# Patient Record
Sex: Female | Born: 2000 | Race: Black or African American | Hispanic: No | Marital: Single | Smoking: Current some day smoker
Health system: Southern US, Community
[De-identification: ages and names within clinical notes are randomized; demographics above are authoritative.]

## PROBLEM LIST (undated history)

## (undated) DIAGNOSIS — D649 Anemia, unspecified: Secondary | ICD-10-CM

## (undated) DIAGNOSIS — F3181 Bipolar II disorder: Secondary | ICD-10-CM

## (undated) DIAGNOSIS — Z975 Presence of (intrauterine) contraceptive device: Secondary | ICD-10-CM

## (undated) DIAGNOSIS — F29 Unspecified psychosis not due to a substance or known physiological condition: Secondary | ICD-10-CM

## (undated) DIAGNOSIS — F909 Attention-deficit hyperactivity disorder, unspecified type: Secondary | ICD-10-CM

## (undated) DIAGNOSIS — R079 Chest pain, unspecified: Secondary | ICD-10-CM

## (undated) DIAGNOSIS — G4700 Insomnia, unspecified: Secondary | ICD-10-CM

## (undated) DIAGNOSIS — F1220 Cannabis dependence, uncomplicated: Secondary | ICD-10-CM

## (undated) DIAGNOSIS — F17200 Nicotine dependence, unspecified, uncomplicated: Secondary | ICD-10-CM

## (undated) DIAGNOSIS — F411 Generalized anxiety disorder: Secondary | ICD-10-CM

## (undated) HISTORY — DX: Attention-deficit hyperactivity disorder, unspecified type: F90.9

## (undated) HISTORY — DX: Anemia, unspecified: D64.9

## (undated) HISTORY — PX: TONSILLECTOMY AND ADENOIDECTOMY: SHX28

## (undated) HISTORY — DX: Chest pain, unspecified: R07.9

## (undated) HISTORY — DX: Insomnia, unspecified: G47.00

## (undated) HISTORY — DX: Cannabis dependence, uncomplicated: F12.20

## (undated) HISTORY — DX: Generalized anxiety disorder: F41.1

## (undated) HISTORY — DX: Unspecified psychosis not due to a substance or known physiological condition: F29

## (undated) HISTORY — DX: Nicotine dependence, unspecified, uncomplicated: F17.200

## (undated) HISTORY — DX: Bipolar II disorder: F31.81

---
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
## 2019-02-22 ENCOUNTER — Encounter: Payer: Self-pay | Admitting: Family Medicine

## 2019-02-22 ENCOUNTER — Other Ambulatory Visit: Payer: Self-pay

## 2019-02-22 ENCOUNTER — Ambulatory Visit (INDEPENDENT_AMBULATORY_CARE_PROVIDER_SITE_OTHER): Payer: Medicaid Other | Admitting: Family Medicine

## 2019-02-22 VITALS — BP 114/66 | HR 58 | Temp 98.4°F | Ht 64.5 in | Wt 189.2 lb

## 2019-02-22 DIAGNOSIS — Z789 Other specified health status: Secondary | ICD-10-CM | POA: Diagnosis not present

## 2019-02-22 DIAGNOSIS — N926 Irregular menstruation, unspecified: Secondary | ICD-10-CM | POA: Diagnosis not present

## 2019-02-22 DIAGNOSIS — Z09 Encounter for follow-up examination after completed treatment for conditions other than malignant neoplasm: Secondary | ICD-10-CM | POA: Diagnosis not present

## 2019-02-22 NOTE — Progress Notes (Signed)
Patient [facility]   Established Patient Office Visit  Subjective:  Patient ID: Courtney Ashley, female    DOB: 2001-05-17  Age: 18 y.o. MRN: [id number]  CC:  Chief Complaint  Patient presents with  . Follow-up    3 month follow up , no other concerns     HPI Courtney Ashley is a 18 year old female who presents for Follow Up today.   Past Medical History:  Diagnosis Date  . ADHD (attention deficit hyperactivity disorder)    childhood  . Anemia    Current Status: Since her last office visit, she is doing well with no complaints. She states that she continues to have mild bleeding episodes, but they are stable. She continues to use Nexplanon since 2018. She denies fevers, chills, fatigue, recent infections, weight loss, and night sweats. She has not had any headaches, visual changes, dizziness, and falls. No chest pain, heart palpitations, cough and shortness of breath reported. No reports of GI problems such as nausea, vomiting, diarrhea, and constipation. She has no reports of blood in stools, dysuria and hematuria. No depression or anxiety reported. She denies pain today.   Past Surgical History:  Procedure Laterality Date  . TONSILLECTOMY AND ADENOIDECTOMY      Family History  Problem Relation Age of Onset  . Diabetes Maternal Grandmother     Social History   Socioeconomic History  . Marital status: Single    Spouse name: Not on file  . Number of children: Not on file  . Years of education: Not on file  . Highest education level: Not on file  Occupational History  . Not on file  Social Needs  . Financial resource strain: Not on file  . Food insecurity    Worry: Not on file    Inability: Not on file  . Transportation needs    Medical: Not on file    Non-medical: Not on file  Tobacco Use  . Smoking status: Never Smoker  . Smokeless tobacco: Never Used  Substance and Sexual Activity  . Alcohol use: Not Currently  . Drug  use: Yes    Types: Marijuana  . Sexual activity: Yes    Birth control/protection: Implant  Lifestyle  . Physical activity    Days per week: Not on file    Minutes per session: Not on file  . Stress: Not on file  Relationships  . Social Herbalist on phone: Not on file    Gets together: Not on file    Attends religious service: Not on file    Active member of club or organization: Not on file    Attends meetings of clubs or organizations: Not on file    Relationship status: Not on file  . Intimate partner violence    Fear of current or ex partner: Not on file    Emotionally abused: Not on file    Physically abused: Not on file    Forced sexual activity: Not on file  Other Topics Concern  . Not on file  Social History Narrative  . Not on file    Outpatient Medications Prior to Visit  Medication Sig Dispense Refill  . etonogestrel (NEXPLANON) 68 MG IMPL implant 1 each by Subdermal route once.    . sulfamethoxazole-trimethoprim (BACTRIM DS) 800-160 MG tablet Take 1 tablet by mouth 2 (two) times daily. (Patient not taking: Reported on 02/22/2019) 14 tablet 0   No facility-administered medications prior  to visit.     No Known Allergies  ROS Review of Systems  Constitutional: Negative.   HENT: Negative.   Eyes: Negative.   Respiratory: Negative.   Cardiovascular: Negative.   Gastrointestinal: Negative.   Endocrine: Negative.   Genitourinary: Positive for vaginal bleeding.       Irregular vaginal bleeding, r/t Nexplanon  Musculoskeletal: Negative.   Skin: Negative.   Allergic/Immunologic: Negative.   Neurological: Negative.   Hematological: Negative.   Psychiatric/Behavioral: Negative.    Objective:    Physical Exam  Constitutional: She is oriented to person, place, and time. She appears well-developed and well-nourished.  HENT:  Head: Atraumatic.  Eyes: Conjunctivae are normal.  Neck: Normal range of motion. Neck supple.  Cardiovascular: Normal rate,  regular rhythm, normal heart sounds and intact distal pulses.  Pulmonary/Chest: Effort normal and breath sounds normal.  Abdominal: Soft. Bowel sounds are normal.  Musculoskeletal: Normal range of motion.  Neurological: She is alert and oriented to person, place, and time. She has normal reflexes.  Skin: Skin is warm and dry.  Psychiatric: She has a normal mood and affect. Her behavior is normal. Judgment and thought content normal.  Vitals reviewed.   BP 114/66 (BP Location: Right Arm, Patient Position: Sitting, Cuff Size: Normal)   Pulse (!) 58   Temp 98.4 F (36.9 C) (Oral)   Ht 5' 4.5" (1.638 m)   Wt 189 lb 3.2 oz (85.8 kg)   LMP 02/11/2019   SpO2 100%   BMI 31.97 kg/m  Wt Readings from Last 3 Encounters:  02/22/19 189 lb 3.2 oz (85.8 kg) (96 %, Z= 1.78)*  11/21/18 205 lb (93 kg) (98 %, Z= 2.02)*  11/19/18 203 lb (92.1 kg) (98 %, Z= 1.99)*   * Growth percentiles are based on CDC (Girls, 2-20 Years) data.   Health Maintenance Due  Topic Date Due  . INFLUENZA VACCINE  01/05/2019   There are no preventive care reminders to display for this patient.  No results found for: TSH Lab Results  Component Value Date   WBC 9.6 11/21/2018   HGB 13.3 11/21/2018   HCT 40.3 11/21/2018   MCV 79 11/21/2018   PLT 269 11/21/2018   Lab Results  Component Value Date   NA 139 11/21/2018   K 3.6 11/21/2018   CO2 22 11/21/2018   GLUCOSE 92 11/21/2018   BUN 9 11/21/2018   CREATININE 0.87 11/21/2018   BILITOT 0.2 11/21/2018   ALKPHOS 46 11/21/2018   AST 12 11/21/2018   ALT 6 11/21/2018   PROT 6.8 11/21/2018   ALBUMIN 4.7 11/21/2018   CALCIUM 9.9 11/21/2018   No results found for: CHOL No results found for: HDL No results found for: LDLCALC No results found for: TRIG No results found for: [facility] Lab Results  Component Value Date   HGBA1C 5.6 11/21/2018   Assessment & Plan:   1. Irregular menstrual bleeding Mild.   2. Uses contraceptive implant for birth control  Currently using Nexplanon for contraception.  3. Follow up She will follow up in 1 year or as needed.   No orders of the defined types were placed in this encounter.   No orders of the defined types were placed in this encounter.   Referral Orders  No referral(s) requested today    Raliegh Ip,  MSN, FNP-BC [facility] [address] [place], [place] [address] [phone] fax   Problem List Items Addressed This  Visit    None    Visit Diagnoses    Irregular menstrual bleeding    -  Primary   Uses contraceptive implant for birth control       Follow up          No orders of the defined types were placed in this encounter.   Follow-up: Return in about 1 year (around 02/22/2020).    Kallie LocksNatalie M Verbon Giangregorio, FNP

## 2019-08-09 ENCOUNTER — Telehealth: Payer: Self-pay | Admitting: Family Medicine

## 2019-08-09 NOTE — Telephone Encounter (Signed)
Called pt and reminded them of appointment on Monday

## 2019-08-12 ENCOUNTER — Other Ambulatory Visit: Payer: Self-pay

## 2019-08-12 ENCOUNTER — Encounter: Payer: Self-pay | Admitting: Family Medicine

## 2019-08-12 ENCOUNTER — Ambulatory Visit (INDEPENDENT_AMBULATORY_CARE_PROVIDER_SITE_OTHER): Payer: Medicaid Other | Admitting: Family Medicine

## 2019-08-12 VITALS — BP 111/66 | HR 89 | Temp 99.3°F | Ht 64.2 in | Wt 147.2 lb

## 2019-08-12 DIAGNOSIS — Z789 Other specified health status: Secondary | ICD-10-CM

## 2019-08-12 DIAGNOSIS — R8290 Unspecified abnormal findings in urine: Secondary | ICD-10-CM

## 2019-08-12 DIAGNOSIS — Z0000 Encounter for general adult medical examination without abnormal findings: Secondary | ICD-10-CM

## 2019-08-12 DIAGNOSIS — Z09 Encounter for follow-up examination after completed treatment for conditions other than malignant neoplasm: Secondary | ICD-10-CM

## 2019-08-12 DIAGNOSIS — Z3046 Encounter for surveillance of implantable subdermal contraceptive: Secondary | ICD-10-CM | POA: Diagnosis not present

## 2019-08-12 DIAGNOSIS — F419 Anxiety disorder, unspecified: Secondary | ICD-10-CM | POA: Diagnosis not present

## 2019-08-12 LAB — POCT URINALYSIS DIPSTICK
Glucose, UA: NEGATIVE
Ketones, UA: 15
Nitrite, UA: POSITIVE
Protein, UA: POSITIVE — AB
Spec Grav, UA: >=1.03 — AB (ref 1.010–1.025)
Urobilinogen, UA: 0.2 E.U./dL
pH, UA: 6 (ref 5.0–8.0)

## 2019-08-12 LAB — POCT URINE PREGNANCY: Preg Test, Ur: NEGATIVE

## 2019-08-12 MED ORDER — BUSPIRONE HCL 5 MG PO TABS: 5.0000 mg | ORAL_TABLET | Freq: Two times a day (BID) | ORAL | 3 refills | Status: DC

## 2019-08-12 MED ORDER — NORETHIN ACE-ETH ESTRAD-FE 1-20 MG-MCG PO TABS: 1.0000 | ORAL_TABLET | Freq: Every day | ORAL | 11 refills | Status: DC

## 2019-08-12 NOTE — Patient Instructions (Signed)
Contraception Choices Contraception, also called birth control, means things to use or ways to try not to get pregnant. Hormonal birth control This kind of birth control uses hormones. Here are some types of hormonal birth control:  A tube that is put under skin of the arm (implant). The tube can stay in for as long as 3 years.  Shots to get every 3 months (injections).  Pills to take every day (birth control pills).  A patch to change 1 time each week for 3 weeks (birth control patch). After that, the patch is taken off for 1 week.  A ring to put in the vagina. The ring is left in for 3 weeks. Then it is taken out of the vagina for 1 week. Then a new ring is put in.  Pills to take after unprotected sex (emergency birth control pills). Barrier birth control Here are some types of barrier birth control:  A thin covering that is put on the penis before sex (female condom). The covering is thrown away after sex.  A soft, loose covering that is put in the vagina before sex (female condom). The covering is thrown away after sex.  A rubber bowl that sits over the cervix (diaphragm). The bowl must be made for you. The bowl is put into the vagina before sex. The bowl is left in for 6-8 hours after sex. It is taken out within 24 hours.  A small, soft cup that fits over the cervix (cervical cap). The cup must be made for you. The cup can be left in for 6-8 hours after sex. It is taken out within 48 hours.  A sponge that is put into the vagina before sex. It must be left in for at least 6 hours after sex. It must be taken out within 30 hours. Then it is thrown away.  A chemical that kills or stops sperm from getting into the uterus (spermicide). It may be a pill, cream, jelly, or foam to put in the vagina. The chemical should be used at least 10-15 minutes before sex. IUD (intrauterine) birth control An IUD is a small, T-shaped piece of plastic. It is put inside the uterus. There are two  kinds:  Hormone IUD. This kind can stay in for 3-5 years.  Copper IUD. This kind can stay in for 10 years. Permanent birth control Here are some types of permanent birth control:  Surgery to block the fallopian tubes.  Having an insert put into each fallopian tube.  Surgery to tie off the tubes that carry sperm (vasectomy). Natural planning birth control Here are some types of natural planning birth control:  Not having sex on the days the woman could get pregnant.  Using a calendar: ? To keep track of the length of each period. ? To find out what days pregnancy can happen. ? To plan to not have sex on days when pregnancy can happen.  Watching for symptoms of ovulation and not having sex during ovulation. One way the woman can check for ovulation is to check her temperature.  Waiting to have sex until after ovulation. Summary  Contraception, also called birth control, means things to use or ways to try not to get pregnant.  Hormonal methods of birth control include implants, injections, pills, patches, vaginal rings, and emergency birth control pills.  Barrier methods of birth control can include female condoms, female condoms, diaphragms, cervical caps, sponges, and spermicides.  There are two types of IUD (intrauterine device) birth control.   An IUD can be put in a woman's uterus to prevent pregnancy for 3-5 years.  Permanent sterilization can be done through a procedure for males, females, or both.  Natural planning methods involve not having sex on the days when the woman could get pregnant. This information is not intended to replace advice given to you by your health care provider. Make sure you discuss any questions you have with your health care provider. Document Revised: 09/12/2018 Document Reviewed: 06/02/2016 Elsevier Patient Education  2020 Elsevier Inc. Buspirone tablets What is this medicine? BUSPIRONE (byoo SPYE rone) is used to treat anxiety disorders. This  medicine may be used for other purposes; ask your health care provider or pharmacist if you have questions. COMMON BRAND NAME(S): BuSpar What should I tell my health care provider before I take this medicine? They need to know if you have any of these conditions:  kidney or liver disease  an unusual or allergic reaction to buspirone, other medicines, foods, dyes, or preservatives  pregnant or trying to get pregnant  breast-feeding How should I use this medicine? Take this medicine by mouth with a glass of water. Follow the directions on the prescription label. You may take this medicine with or without food. To ensure that this medicine always works the same way for you, you should take it either always with or always without food. Take your doses at regular intervals. Do not take your medicine more often than directed. Do not stop taking except on the advice of your doctor or health care professional. Talk to your pediatrician regarding the use of this medicine in children. Special care may be needed. Overdosage: If you think you have taken too much of this medicine contact a poison control center or emergency room at once. NOTE: This medicine is only for you. Do not share this medicine with others. What if I miss a dose? If you miss a dose, take it as soon as you can. If it is almost time for your next dose, take only that dose. Do not take double or extra doses. What may interact with this medicine? Do not take this medicine with any of the following medications:  linezolid  MAOIs like Carbex, Eldepryl, Marplan, Nardil, and Parnate  methylene blue  procarbazine This medicine may also interact with the following medications:  diazepam  digoxin  diltiazem  erythromycin  grapefruit juice  haloperidol  medicines for mental depression or mood problems  medicines for seizures like carbamazepine, phenobarbital and phenytoin  nefazodone  other medications for  anxiety  rifampin  ritonavir  some antifungal medicines like itraconazole, ketoconazole, and voriconazole  verapamil  warfarin This list may not describe all possible interactions. Give your health care provider a list of all the medicines, herbs, non-prescription drugs, or dietary supplements you use. Also tell them if you smoke, drink alcohol, or use illegal drugs. Some items may interact with your medicine. What should I watch for while using this medicine? Visit your doctor or health care professional for regular checks on your progress. It may take 1 to 2 weeks before your anxiety gets better. You may get drowsy or dizzy. Do not drive, use machinery, or do anything that needs mental alertness until you know how this drug affects you. Do not stand or sit up quickly, especially if you are an older patient. This reduces the risk of dizzy or fainting spells. Alcohol can make you more drowsy and dizzy. Avoid alcoholic drinks. What side effects may I notice from  receiving this medicine? Side effects that you should report to your doctor or health care professional as soon as possible:  blurred vision or other vision changes  chest pain  confusion  difficulty breathing  feelings of hostility or anger  muscle aches and pains  numbness or tingling in hands or feet  ringing in the ears  skin rash and itching  vomiting  weakness Side effects that usually do not require medical attention (report to your doctor or health care professional if they continue or are bothersome):  disturbed dreams, nightmares  headache  nausea  restlessness or nervousness  sore throat and nasal congestion  stomach upset This list may not describe all possible side effects. Call your doctor for medical advice about side effects. You may report side effects to FDA at 1-800-FDA-1088. Where should I keep my medicine? Keep out of the reach of children. Store at room temperature below 30 degrees C  (86 degrees F). Protect from light. Keep container tightly closed. Throw away any unused medicine after the expiration date. NOTE: This sheet is a summary. It may not cover all possible information. If you have questions about this medicine, talk to your doctor, pharmacist, or health care provider.  2020 Elsevier/Gold Standard (2009-12-31 18:06:11) Generalized Anxiety Disorder, Adult Generalized anxiety disorder (GAD) is a mental health disorder. People with this condition constantly worry about everyday events. Unlike normal anxiety, worry related to GAD is not triggered by a specific event. These worries also do not fade or get better with time. GAD interferes with life functions, including relationships, work, and school. GAD can vary from mild to severe. People with severe GAD can have intense waves of anxiety with physical symptoms (panic attacks). What are the causes? The exact cause of GAD is not known. What increases the risk? This condition is more likely to develop in:  Women.  People who have a family history of anxiety disorders.  People who are very shy.  People who experience very stressful life events, such as the death of a loved one.  People who have a very stressful family environment. What are the signs or symptoms? People with GAD often worry excessively about many things in their lives, such as their health and family. They may also be overly concerned about:  Doing well at work.  Being on time.  Natural disasters.  Friendships. Physical symptoms of GAD include:  Fatigue.  Muscle tension or having muscle twitches.  Trembling or feeling shaky.  Being easily startled.  Feeling like your heart is pounding or racing.  Feeling out of breath or like you cannot take a deep breath.  Having trouble falling asleep or staying asleep.  Sweating.  Nausea, diarrhea, or irritable bowel syndrome (IBS).  Headaches.  Trouble concentrating or remembering  facts.  Restlessness.  Irritability. How is this diagnosed? Your health care provider can diagnose GAD based on your symptoms and medical history. You will also have a physical exam. The health care provider will ask specific questions about your symptoms, including how severe they are, when they started, and if they come and go. Your health care provider may ask you about your use of alcohol or drugs, including prescription medicines. Your health care provider may refer you to a mental health specialist for further evaluation. Your health care provider will do a thorough examination and may perform additional tests to rule out other possible causes of your symptoms. To be diagnosed with GAD, a person must have anxiety that:  Is  out of his or her control.  Affects several different aspects of his or her life, such as work and relationships.  Causes distress that makes him or her unable to take part in normal activities.  Includes at least three physical symptoms of GAD, such as restlessness, fatigue, trouble concentrating, irritability, muscle tension, or sleep problems. Before your health care provider can confirm a diagnosis of GAD, these symptoms must be present more days than they are not, and they must last for six months or longer. How is this treated? The following therapies are usually used to treat GAD:  Medicine. Antidepressant medicine is usually prescribed for long-term daily control. Antianxiety medicines may be added in severe cases, especially when panic attacks occur.  Talk therapy (psychotherapy). Certain types of talk therapy can be helpful in treating GAD by providing support, education, and guidance. Options include: ? Cognitive behavioral therapy (CBT). People learn coping skills and techniques to ease their anxiety. They learn to identify unrealistic or negative thoughts and behaviors and to replace them with positive ones. ? Acceptance and commitment therapy (ACT).  This treatment teaches people how to be mindful as a way to cope with unwanted thoughts and feelings. ? Biofeedback. This process trains you to manage your body's response (physiological response) through breathing techniques and relaxation methods. You will work with a therapist while machines are used to monitor your physical symptoms.  Stress management techniques. These include yoga, meditation, and exercise. A mental health specialist can help determine which treatment is best for you. Some people see improvement with one type of therapy. However, other people require a combination of therapies. Follow these instructions at home:  Take over-the-counter and prescription medicines only as told by your health care provider.  Try to maintain a normal routine.  Try to anticipate stressful situations and allow extra time to manage them.  Practice any stress management or self-calming techniques as taught by your health care provider.  Do not punish yourself for setbacks or for not making progress.  Try to recognize your accomplishments, even if they are small.  Keep all follow-up visits as told by your health care provider. This is important. Contact a health care provider if:  Your symptoms do not get better.  Your symptoms get worse.  You have signs of depression, such as: ? A persistently sad, cranky, or irritable mood. ? Loss of enjoyment in activities that used to bring you joy. ? Change in weight or eating. ? Changes in sleeping habits. ? Avoiding friends or family members. ? Loss of energy for normal tasks. ? Feelings of guilt or worthlessness. Get help right away if:  You have serious thoughts about hurting yourself or others. If you ever feel like you may hurt yourself or others, or have thoughts about taking your own life, get help right away. You can go to your nearest emergency department or call:  Your local emergency services (911 in the [place]  A suicide crisis  helpline, such as the National Suicide Prevention Lifeline at (506)778-4372. This is open 24 hours a day. Summary  Generalized anxiety disorder (GAD) is a mental health disorder that involves worry that is not triggered by a specific event.  People with GAD often worry excessively about many things in their lives, such as their health and family.  GAD may cause physical symptoms such as restlessness, trouble concentrating, sleep problems, frequent sweating, nausea, diarrhea, headaches, and trembling or muscle twitching.  A mental health specialist can help determine which treatment  is best for you. Some people see improvement with one type of therapy. However, other people require a combination of therapies. This information is not intended to replace advice given to you by your health care provider. Make sure you discuss any questions you have with your health care provider. Document Revised: 05/05/2017 Document Reviewed: 04/12/2016 Elsevier Patient Education  2020 [employer].

## 2019-08-12 NOTE — Progress Notes (Signed)
[facility]    Established Patient Office Visit  Subjective:  Patient ID: Courtney Ashley, female    DOB: April 27, 2001  Age: 19 y.o. MRN: [id number]  CC:  Chief Complaint  Patient presents with  . Follow-up    Nexplanon removed    HPI Courtney Ashley is a 19 year old female who presents for Follow Up today.   Past Medical History:  Diagnosis Date  . ADHD (attention deficit hyperactivity disorder)    childhood  . Anemia   . Nexplanon in place 2018   Current Status: Since her last office visit, she is doing well with no complaints.  She is here today for Nexplanon removal. She is also requested for anxiety medication. She denies suicidal ideations, homicidal ideations, or auditory hallucinations. She denies fevers, chills, fatigue, recent infections, weight loss, and night sweats. She has not had any headaches, visual changes, dizziness, and falls. No chest pain, heart palpitations, cough and shortness of breath reported. No reports of GI problems such as nausea, vomiting, diarrhea, and constipation. She has no reports of blood in stools, dysuria and hematuria. She denies pain today.   Past Surgical History:  Procedure Laterality Date  . TONSILLECTOMY AND ADENOIDECTOMY      Family History  Problem Relation Age of Onset  . Diabetes Maternal Grandmother     Social History   Socioeconomic History  . Marital status: Single    Spouse name: Not on file  . Number of children: Not on file  . Years of education: Not on file  . Highest education level: Not on file  Occupational History  . Not on file  Tobacco Use  . Smoking status: Never Smoker  . Smokeless tobacco: Never Used  Substance and Sexual Activity  . Alcohol use: Not Currently  . Drug use: Yes    Types: Marijuana  . Sexual activity: Yes    Birth control/protection: Implant  Other Topics Concern  . Not on file  Social History Narrative  . Not on file   Social  Determinants of Health   Financial Resource Strain:   . Difficulty of Paying Living Expenses: Not on file  Food Insecurity:   . Worried About Programme researcher, broadcasting/film/video in the Last Year: Not on file  . Ran Out of Food in the Last Year: Not on file  Transportation Needs:   . Lack of Transportation (Medical): Not on file  . Lack of Transportation (Non-Medical): Not on file  Physical Activity:   . Days of Exercise per Week: Not on file  . Minutes of Exercise per Session: Not on file  Stress:   . Feeling of Stress : Not on file  Social Connections:   . Frequency of Communication with Friends and Family: Not on file  . Frequency of Social Gatherings with Friends and Family: Not on file  . Attends Religious Services: Not on file  . Active Member of Clubs or Organizations: Not on file  . Attends Banker Meetings: Not on file  . Marital Status: Not on file  Intimate Partner Violence:   . Fear of Current or Ex-Partner: Not on file  . Emotionally Abused: Not on file  . Physically Abused: Not on file  . Sexually Abused: Not on file    Outpatient Medications Prior to Visit  Medication Sig Dispense Refill  . etonogestrel (NEXPLANON) 68 MG IMPL implant 1 each by Subdermal route once.    . sulfamethoxazole-trimethoprim (BACTRIM DS)  800-160 MG tablet Take 1 tablet by mouth 2 (two) times daily. (Patient not taking: Reported on 02/22/2019) 14 tablet 0   No facility-administered medications prior to visit.    No Known Allergies  ROS Review of Systems  Constitutional: Negative.   HENT: Negative.   Eyes: Negative.   Respiratory: Negative.   Cardiovascular: Negative.   Gastrointestinal: Negative.   Endocrine: Negative.   Genitourinary: Negative.   Musculoskeletal: Negative.   Skin: Negative.   Allergic/Immunologic: Negative.   Neurological: Negative.   Hematological: Negative.   Psychiatric/Behavioral: Negative.       Objective:    Physical Exam  Constitutional: She is  oriented to person, place, and time. She appears well-developed and well-nourished.  HENT:  Head: Normocephalic and atraumatic.  Eyes: Conjunctivae are normal.  Cardiovascular: Normal rate, regular rhythm, normal heart sounds and intact distal pulses.  Pulmonary/Chest: Effort normal and breath sounds normal.  Abdominal: Soft. Bowel sounds are normal.  Musculoskeletal:        General: Normal range of motion.     Cervical back: Normal range of motion and neck supple.  Neurological: She is alert and oriented to person, place, and time. She has normal reflexes.  Skin: Skin is warm.  Psychiatric: She has a normal mood and affect. Her behavior is normal. Judgment and thought content normal.  Nursing note and vitals reviewed.   BP 111/66   Pulse 89   Temp 99.3 F (37.4 C) (Oral)   Ht 5' 4.2" (1.631 m)   Wt 147 lb 3.2 oz (66.8 kg)   SpO2 100%   BMI 25.11 kg/m  Wt Readings from Last 3 Encounters:  08/12/19 147 lb 3.2 oz (66.8 kg) (77 %, Z= 0.75)*  02/22/19 189 lb 3.2 oz (85.8 kg) (96 %, Z= 1.78)*  11/21/18 205 lb (93 kg) (98 %, Z= 2.02)*   * Growth percentiles are based on CDC (Girls, 2-20 Years) data.     Health Maintenance Due  Topic Date Due  . INFLUENZA VACCINE  01/05/2019    There are no preventive care reminders to display for this patient.  No results found for: TSH Lab Results  Component Value Date   WBC 9.6 11/21/2018   HGB 13.3 11/21/2018   HCT 40.3 11/21/2018   MCV 79 11/21/2018   PLT 269 11/21/2018   Lab Results  Component Value Date   NA 139 11/21/2018   K 3.6 11/21/2018   CO2 22 11/21/2018   GLUCOSE 92 11/21/2018   BUN 9 11/21/2018   CREATININE 0.87 11/21/2018   BILITOT 0.2 11/21/2018   ALKPHOS 46 11/21/2018   AST 12 11/21/2018   ALT 6 11/21/2018   PROT 6.8 11/21/2018   ALBUMIN 4.7 11/21/2018   CALCIUM 9.9 11/21/2018   No results found for: CHOL No results found for: HDL No results found for: LDLCALC No results found for: TRIG No results  found for: [facility] Lab Results  Component Value Date   HGBA1C 5.6 11/21/2018      Assessment & Plan:   1. Nexplanon removal Implant removed from left arm successfully. Patient tolerated well. She will keep dressing on X 24 hours.   2. Uses contraceptive implant for birth control - POCT urine pregnancy - norethindrone-ethinyl estradiol (LOESTRIN FE) 1-20 MG-MCG tablet; Take 1 tablet by mouth daily.  Dispense: 1 Package; Refill: 11  3. Anxiety We will initiate Buspar today.  - busPIRone (BUSPAR) 5 MG tablet; Take 1 tablet (5 mg total) by mouth 2 (two) times daily.  Dispense: 60 tablet; Refill: 3  4. Health care maintenance - POCT urinalysis dipstick  5. Abnormal urine Results are pending.  - Urine Culture  6. Follow up She will follow up in 2 months.   Meds ordered this encounter  Medications  . norethindrone-ethinyl estradiol (LOESTRIN FE) 1-20 MG-MCG tablet    Sig: Take 1 tablet by mouth daily.    Dispense:  1 Package    Refill:  11  . busPIRone (BUSPAR) 5 MG tablet    Sig: Take 1 tablet (5 mg total) by mouth 2 (two) times daily.    Dispense:  60 tablet    Refill:  3    Orders Placed This Encounter  Procedures  . Urine Culture  . POCT urinalysis dipstick  . POCT urine pregnancy    Referral Orders  No referral(s) requested today    Kathe Becton,  MSN, FNP-BC [place], [place] [address] [phone] fax   Problem List Items Addressed This Visit      Other   Uses contraceptive implant for birth control   Relevant Medications   norethindrone-ethinyl estradiol (LOESTRIN FE) 1-20 MG-MCG tablet   Other Relevant Orders   POCT urine pregnancy (Completed)    Other Visit Diagnoses    Nexplanon removal    -  Primary   Anxiety       Relevant Medications   busPIRone (BUSPAR) 5 MG tablet   Health care maintenance       Relevant Orders   POCT  urinalysis dipstick (Completed)   Abnormal urine       Relevant Orders   Urine Culture   Follow up          Meds ordered this encounter  Medications  . norethindrone-ethinyl estradiol (LOESTRIN FE) 1-20 MG-MCG tablet    Sig: Take 1 tablet by mouth daily.    Dispense:  1 Package    Refill:  11  . busPIRone (BUSPAR) 5 MG tablet    Sig: Take 1 tablet (5 mg total) by mouth 2 (two) times daily.    Dispense:  60 tablet    Refill:  3    Follow-up: Return in about 2 months (around 10/12/2019).    Azzie Glatter, FNP

## 2019-08-14 DIAGNOSIS — F419 Anxiety disorder, unspecified: Secondary | ICD-10-CM | POA: Insufficient documentation

## 2019-08-15 LAB — URINE CULTURE

## 2019-08-18 ENCOUNTER — Other Ambulatory Visit: Payer: Self-pay | Admitting: Family Medicine

## 2019-08-18 DIAGNOSIS — N390 Urinary tract infection, site not specified: Secondary | ICD-10-CM

## 2019-08-18 MED ORDER — NITROFURANTOIN MONOHYD MACRO 100 MG PO CAPS: 100.0000 mg | ORAL_CAPSULE | Freq: Two times a day (BID) | ORAL | 0 refills | Status: AC

## 2019-08-20 ENCOUNTER — Telehealth: Payer: Self-pay

## 2019-08-20 NOTE — Telephone Encounter (Signed)
-----   Message from Kallie Locks, FNP sent at 08/18/2019  9:09 PM EDT ----- Urine culture identified bacteria. Please inform patient that we have sent new Rx for Macrobid to pharmacy today. This antibiotic is specific to eliminate this bacteria. She is to take medication as prescribed. She is to complete all medication. Increase fluids. Keep follow up appointment as scheduled. Please inform patient.

## 2019-08-20 NOTE — Telephone Encounter (Signed)
Second message left today for a call back.

## 2019-08-22 NOTE — Telephone Encounter (Signed)
Multiple attempts to reach out to [facility] with no success. Her contacts where asked to notify patient to call back. Still no success. Lab result and message mailed out today.

## 2019-08-22 NOTE — Telephone Encounter (Signed)
-----   Message from Natalie M Stroud, FNP sent at 08/18/2019  9:09 PM EDT ----- Urine culture identified bacteria. Please inform patient that we have sent new Rx for Macrobid to pharmacy today. This antibiotic is specific to eliminate this bacteria. She is to take medication as prescribed. She is to complete all medication. Increase fluids. Keep follow up appointment as scheduled. Please inform patient.  

## 2019-10-09 ENCOUNTER — Other Ambulatory Visit: Payer: Self-pay

## 2019-10-09 ENCOUNTER — Encounter (HOSPITAL_COMMUNITY): Payer: Self-pay | Admitting: Emergency Medicine

## 2019-10-09 ENCOUNTER — Emergency Department (HOSPITAL_COMMUNITY)
Admission: EM | Admit: 2019-10-09 | Discharge: 2019-10-09 | Disposition: A | Discharge status: Home / Self Care | Payer: Medicaid Other | Attending: Emergency Medicine | Admitting: Emergency Medicine

## 2019-10-09 DIAGNOSIS — Z5321 Procedure and treatment not carried out due to patient leaving prior to being seen by health care provider: Secondary | ICD-10-CM | POA: Diagnosis not present

## 2019-10-09 DIAGNOSIS — K0889 Other specified disorders of teeth and supporting structures: Secondary | ICD-10-CM | POA: Insufficient documentation

## 2019-10-09 NOTE — ED Provider Notes (Signed)
Went to room and patient was not there. RN informed me that patient left. I did not participate in the care of this patient.   Portions of this note were generated with Scientist, clinical (histocompatibility and immunogenetics). Dictation errors may occur despite best attempts at proofreading.     Maxwell Caul, PA-C 10/09/19 1113    Terrilee Files, MD 10/10/19 [phone]

## 2019-10-09 NOTE — ED Notes (Signed)
Called patient name in the lobby to be triage and no one responded 

## 2019-10-09 NOTE — ED Triage Notes (Signed)
Per pt, states she has a cavity in right upper tooth-woke up this am with right facial swelling-had wisdom teeth pulled 4/10

## 2019-10-21 ENCOUNTER — Ambulatory Visit: Payer: Medicaid Other | Admitting: Family Medicine

## 2019-11-20 ENCOUNTER — Emergency Department (HOSPITAL_COMMUNITY)
Admission: EM | Admit: 2019-11-20 | Discharge: 2019-11-21 | Disposition: A | Discharge status: Home / Self Care | Payer: Medicaid Other | Attending: Emergency Medicine | Admitting: Emergency Medicine

## 2019-11-20 ENCOUNTER — Other Ambulatory Visit: Payer: Self-pay

## 2019-11-20 ENCOUNTER — Encounter (HOSPITAL_COMMUNITY): Payer: Self-pay

## 2019-11-20 DIAGNOSIS — D649 Anemia, unspecified: Secondary | ICD-10-CM | POA: Diagnosis not present

## 2019-11-20 DIAGNOSIS — Y999 Unspecified external cause status: Secondary | ICD-10-CM | POA: Insufficient documentation

## 2019-11-20 DIAGNOSIS — S61212A Laceration without foreign body of right middle finger without damage to nail, initial encounter: Secondary | ICD-10-CM | POA: Diagnosis present

## 2019-11-20 DIAGNOSIS — Y92039 Unspecified place in apartment as the place of occurrence of the external cause: Secondary | ICD-10-CM | POA: Diagnosis not present

## 2019-11-20 DIAGNOSIS — Z23 Encounter for immunization: Secondary | ICD-10-CM | POA: Diagnosis not present

## 2019-11-20 DIAGNOSIS — W458XXA Other foreign body or object entering through skin, initial encounter: Secondary | ICD-10-CM | POA: Diagnosis not present

## 2019-11-20 DIAGNOSIS — F909 Attention-deficit hyperactivity disorder, unspecified type: Secondary | ICD-10-CM | POA: Insufficient documentation

## 2019-11-20 DIAGNOSIS — Y9389 Activity, other specified: Secondary | ICD-10-CM | POA: Diagnosis not present

## 2019-11-20 DIAGNOSIS — S51811A Laceration without foreign body of right forearm, initial encounter: Secondary | ICD-10-CM | POA: Diagnosis not present

## 2019-11-20 MED ORDER — TETANUS-DIPHTH-ACELL PERTUSSIS 5-2.5-18.5 LF-MCG/0.5 IM SUSP
0.5000 mL | Freq: Once | INTRAMUSCULAR | Status: AC
  Administered 2019-11-20: 0.5 mL via INTRAMUSCULAR
  Filled 2019-11-20: qty 0.5

## 2019-11-20 MED ORDER — LIDOCAINE HCL (PF) 1 % IJ SOLN
5.0000 mL | Freq: Once | INTRAMUSCULAR | Status: AC
  Administered 2019-11-20: 5 mL via INTRADERMAL
  Filled 2019-11-20: qty 30
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
## 2019-11-20 NOTE — Discharge Instructions (Addendum)
Local wound care with bacitracin and dressing changes twice daily.  Sutures are to be removed in 7 days.  Follow-up with your primary doctor for this.  Return to the ER in the meantime if you develop redness, pus draining from the wound, worsening pain, or other new and concerning symptoms.

## 2019-11-21 NOTE — ED Notes (Signed)
Reviewed discharge instructions and suture care with pt. Pt verbalized understanding.

## 2019-12-16 ENCOUNTER — Telehealth: Payer: Self-pay | Admitting: Family Medicine

## 2019-12-16 NOTE — Telephone Encounter (Signed)
Pt was called and a message was left for pt to call a schedule appointment with Jolene Schimke

## 2019-12-23 ENCOUNTER — Telehealth: Payer: Self-pay | Admitting: Family Medicine

## 2019-12-23 NOTE — Telephone Encounter (Signed)
Called Pt no voicemail option no one answer

## 2022-07-26 ENCOUNTER — Encounter (HOSPITAL_COMMUNITY): Payer: Self-pay

## 2022-07-26 ENCOUNTER — Emergency Department (HOSPITAL_COMMUNITY)
Admission: EM | Admit: 2022-07-26 | Discharge: 2022-07-26 | Disposition: A | Discharge status: Home / Self Care | Payer: Medicaid Other | Attending: Emergency Medicine | Admitting: Emergency Medicine

## 2022-07-26 ENCOUNTER — Other Ambulatory Visit: Payer: Self-pay

## 2022-07-26 DIAGNOSIS — Z20822 Contact with and (suspected) exposure to covid-19: Secondary | ICD-10-CM | POA: Insufficient documentation

## 2022-07-26 DIAGNOSIS — R109 Unspecified abdominal pain: Secondary | ICD-10-CM | POA: Diagnosis present

## 2022-07-26 DIAGNOSIS — E876 Hypokalemia: Secondary | ICD-10-CM | POA: Insufficient documentation

## 2022-07-26 DIAGNOSIS — N3000 Acute cystitis without hematuria: Secondary | ICD-10-CM | POA: Insufficient documentation

## 2022-07-26 LAB — COMPREHENSIVE METABOLIC PANEL
ALT: 10 U/L (ref 0–44)
AST: 14 U/L — ABNORMAL LOW (ref 15–41)
Albumin: 4 g/dL (ref 3.5–5.0)
Alkaline Phosphatase: 33 U/L — ABNORMAL LOW (ref 38–126)
Anion gap: 9 (ref 5–15)
BUN: 6 mg/dL (ref 6–20)
CO2: 24 mmol/L (ref 22–32)
Calcium: 9.1 mg/dL (ref 8.9–10.3)
Chloride: 107 mmol/L (ref 98–111)
Creatinine, Ser: 0.91 mg/dL (ref 0.44–1.00)
GFR, Estimated: >60 mL/min (ref >60)
Glucose, Bld: 88 mg/dL (ref 70–99)
Potassium: 3.3 mmol/L — ABNORMAL LOW (ref 3.5–5.1)
Sodium: 140 mmol/L (ref 135–145)
Total Bilirubin: 0.5 mg/dL (ref 0.3–1.2)
Total Protein: 6.3 g/dL — ABNORMAL LOW (ref 6.5–8.1)

## 2022-07-26 LAB — RESP PANEL BY RT-PCR (RSV, FLU A&B, COVID)  RVPGX2
Influenza A by PCR: NEGATIVE
Influenza B by PCR: NEGATIVE
Resp Syncytial Virus by PCR: NEGATIVE
SARS Coronavirus 2 by RT PCR: NEGATIVE

## 2022-07-26 LAB — CBC WITH DIFFERENTIAL/PLATELET
Abs Immature Granulocytes: 0.02 10*3/uL (ref 0.00–0.07)
Basophils Absolute: 0 10*3/uL (ref 0.0–0.1)
Basophils Relative: 0 %
Eosinophils Absolute: 0 10*3/uL (ref 0.0–0.5)
Eosinophils Relative: 0 %
HCT: 38.9 % (ref 36.0–46.0)
Hemoglobin: 12.5 g/dL (ref 12.0–15.0)
Immature Granulocytes: 0 %
Lymphocytes Relative: 46 %
Lymphs Abs: 3.6 10*3/uL (ref 0.7–4.0)
MCH: 27.6 pg (ref 26.0–34.0)
MCHC: 32.1 g/dL (ref 30.0–36.0)
MCV: 85.9 fL (ref 80.0–100.0)
Monocytes Absolute: 0.5 10*3/uL (ref 0.1–1.0)
Monocytes Relative: 6 %
Neutro Abs: 3.6 10*3/uL (ref 1.7–7.7)
Neutrophils Relative %: 48 %
Platelets: 222 10*3/uL (ref 150–400)
RBC: 4.53 MIL/uL (ref 3.87–5.11)
RDW: 14.6 % (ref 11.5–15.5)
WBC: 7.7 10*3/uL (ref 4.0–10.5)
nRBC: 0 % (ref 0.0–0.2)

## 2022-07-26 LAB — URINALYSIS, ROUTINE W REFLEX MICROSCOPIC
Bilirubin Urine: NEGATIVE
Glucose, UA: NEGATIVE mg/dL
Ketones, ur: 5 mg/dL — AB
Nitrite: POSITIVE — AB
Protein, ur: NEGATIVE mg/dL
Specific Gravity, Urine: 1.017 (ref 1.005–1.030)
pH: 6 (ref 5.0–8.0)

## 2022-07-26 LAB — I-STAT BETA HCG BLOOD, ED (MC, WL, AP ONLY): I-stat hCG, quantitative: <5 m[IU]/mL (ref <5)

## 2022-07-26 LAB — LIPASE, BLOOD: Lipase: 31 U/L (ref 11–51)

## 2022-07-26 MED ORDER — CEPHALEXIN 500 MG PO CAPS: 500.0000 mg | ORAL_CAPSULE | Freq: Four times a day (QID) | ORAL | 0 refills | Status: DC

## 2022-07-26 NOTE — ED Notes (Signed)
Pt left prior to receiving AVS. Pt states she has mychart and can view results and prescriptions on there.

## 2022-07-26 NOTE — ED Provider Triage Note (Signed)
Emergency Medicine Provider Triage Evaluation Note  Courtney Ashley , a 22 y.o. female  was evaluated in triage.  Pt complains of diarrhea and abdominal pain.  Patient apparently had diarrhea after a veggie burger 2 days ago.  She also took a pill of unknown substance that made her stomach hurt and that it turned out to be a Fioricet..  Review of Systems  Positive:  Negative:   Physical Exam  BP (!) 142/83   Pulse 71   Temp 98.9 F (37.2 C) (Oral)   Resp 18   Ht 5' 4"$  (1.626 m)   Wt 64.9 kg   SpO2 100%   BMI 24.55 kg/m  Gen:   Awake, no distress   Resp:  Normal effort  MSK:   Moves extremities without difficulty  Other:  No TTP  Medical Decision Making  Medically screening exam initiated at 3:17 PM.  Appropriate orders placed.  Glennice Bleacher was informed that the remainder of the evaluation will be completed by another provider, this initial triage assessment does not replace that evaluation, and the importance of remaining in the ED until their evaluation is complete.     Margarita Mail, PA-C 07/26/22 1525

## 2022-07-26 NOTE — Discharge Instructions (Signed)
You have a UTI.  Take the Keflex as prescribed.  Return to the ED if you have fever, inability to eat or drink without vomiting, pain moving up to your side or new or concerning symptoms.

## 2022-07-26 NOTE — ED Provider Notes (Signed)
[facility] Provider Note   CSN: [id number] Arrival date & time: 07/26/22  1435     History  Chief Complaint  Patient presents with   Abdominal Pain   Medication Reaction    Courtney Ashley is a 22 y.o. female.   Abdominal Pain    This is a 22 year old female presenting to the emergency department due to abdominal pain.  Patient states 2 days ago she switched from eating meat to try to be vegetarian and was eating veggie patties.  She had having abdominal pain and had an episode of diarrhea.  No blood or mucus, no history of IBD.  She has not had any diarrhea today but states she took a Fioricet which she thought was a muscle relaxer and started feeling "weird" afterwards.  She denies any nausea or vomiting, no previous abdominal surgeries.  She has been been using the bathroom more regularly and feels like over her bladder it is occasionally tight but not specifically painful.  Denies any hematuria or discharge.  Home Medications Prior to Admission medications   Medication Sig Start Date End Date Taking? Authorizing Provider  cephALEXin (KEFLEX) 500 MG capsule Take 1 capsule (500 mg total) by mouth 4 (four) times daily. 07/26/22  Yes Sherrill Raring, PA-C  busPIRone (BUSPAR) 5 MG tablet Take 1 tablet (5 mg total) by mouth 2 (two) times daily. 08/12/19   Azzie Glatter, FNP  etonogestrel (NEXPLANON) 68 MG IMPL implant 1 each by Subdermal route once.    [provider]  norethindrone-ethinyl estradiol (LOESTRIN FE) 1-20 MG-MCG tablet Take 1 tablet by mouth daily. 08/12/19   Azzie Glatter, FNP  sulfamethoxazole-trimethoprim (BACTRIM DS) 800-160 MG tablet Take 1 tablet by mouth 2 (two) times daily. Patient not taking: Reported on 02/22/2019 11/21/18   Azzie Glatter, FNP      Allergies    Fish allergy    Review of Systems   Review of Systems  Gastrointestinal:  Positive for abdominal pain.    Physical Exam Updated Vital  Signs BP (!) 142/83   Pulse 71   Temp 98.9 F (37.2 C) (Oral)   Resp 16   Ht 5' 4"$  (1.626 m)   Wt 64.9 kg   SpO2 100%   BMI 24.55 kg/m  Physical Exam Vitals and nursing note reviewed. Exam conducted with a chaperone present.  Constitutional:      Appearance: Normal appearance.  HENT:     Head: Normocephalic and atraumatic.  Eyes:     General: No scleral icterus.       Right eye: No discharge.        Left eye: No discharge.     Extraocular Movements: Extraocular movements intact.     Pupils: Pupils are equal, round, and reactive to light.  Cardiovascular:     Rate and Rhythm: Normal rate and regular rhythm.     Pulses: Normal pulses.     Heart sounds: Normal heart sounds. No murmur heard.    No friction rub. No gallop.  Pulmonary:     Effort: Pulmonary effort is normal. No respiratory distress.     Breath sounds: Normal breath sounds.  Abdominal:     General: Abdomen is flat. Bowel sounds are normal. There is no distension.     Palpations: Abdomen is soft.     Tenderness: There is abdominal tenderness in the suprapubic area.  Skin:    General: Skin is warm and dry.  Coloration: Skin is not jaundiced.  Neurological:     Mental Status: She is alert. Mental status is at baseline.     Coordination: Coordination normal.     ED Results / Procedures / Treatments   Labs (all labs ordered are listed, but only abnormal results are displayed) Labs Reviewed  COMPREHENSIVE METABOLIC PANEL - Abnormal; Notable for the following components:      Result Value   Potassium 3.3 (*)    Total Protein 6.3 (*)    AST 14 (*)    Alkaline Phosphatase 33 (*)    All other components within normal limits  URINALYSIS, ROUTINE W REFLEX MICROSCOPIC - Abnormal; Notable for the following components:   APPearance HAZY (*)    Hgb urine dipstick SMALL (*)    Ketones, ur 5 (*)    Nitrite POSITIVE (*)    Leukocytes,Ua TRACE (*)    Bacteria, UA MANY (*)    All other components within normal  limits  RESP PANEL BY RT-PCR (RSV, FLU A&B, COVID)  RVPGX2  CBC WITH DIFFERENTIAL/PLATELET  LIPASE, BLOOD  RAPID URINE DRUG SCREEN, HOSP PERFORMED  I-STAT BETA HCG BLOOD, ED ([facility], [facility], [facility] ONLY)    EKG None  Radiology No results found.  Procedures Procedures    Medications Ordered in ED Medications - No data to display  ED Course/ Medical Decision Making/ A&P                             Medical Decision Making Risk Prescription drug management.   Patient is a 22 year old female presenting to the emergency department due to abdominal pain.  Differential diagnosis includes but not limited to dehydration, AKI, ruptured ectopic pregnancy, UTI, pyelonephritis, PID.    Exam is nonperitoneal, no CVA tenderness.  Not having any GU symptoms so otherwise considered PID, torsion, ovarian cyst I think less likely in the overall clinical picture.  Laboratory workup is notable for UTI, given she is afebrile does not need any systemic symptoms not pyelonephritis at this point.  She is not pregnant so not a ruptured ectopic, no gross electrolyte derangement though she is mildly hypokalemic with a potassium of 3.2  Repeat abdominal exam is benign, considered imaging but given she has UTI and no signs of Pilo or nephrolithiasis I do not think indicated.   Workup discussed with the patient, will discharge home with antibiotics.  Return precautions were discussed with the patient.   I suspect she may have had a slight side effect from the medication there is no signs of anaphylaxis.        Final Clinical Impression(s) / ED Diagnoses Final diagnoses:  Acute cystitis without hematuria    Rx / DC Orders ED Discharge Orders          Ordered    cephALEXin (KEFLEX) 500 MG capsule  4 times daily        07/26/22 1657              Sherrill Raring, PA-C 07/26/22 1701    Blanchie Dessert, MD 07/27/22 2101

## 2022-07-26 NOTE — ED Triage Notes (Signed)
Patient reports she took an unknown pill this morning and then started having abd pain.  Reports another doc told her it was tylenol but she is not sure.  Patient reports she has been having diarrhea x 2 days.  Reports started eating veggie patties.

## 2022-09-16 ENCOUNTER — Other Ambulatory Visit: Payer: Self-pay

## 2022-09-16 ENCOUNTER — Emergency Department (HOSPITAL_COMMUNITY)
Admission: EM | Admit: 2022-09-16 | Discharge: 2022-09-16 | Disposition: A | Discharge status: Home / Self Care | Payer: Medicaid Other | Attending: Emergency Medicine | Admitting: Emergency Medicine

## 2022-09-16 ENCOUNTER — Encounter (HOSPITAL_COMMUNITY): Payer: Self-pay

## 2022-09-16 DIAGNOSIS — K047 Periapical abscess without sinus: Secondary | ICD-10-CM | POA: Insufficient documentation

## 2022-09-16 DIAGNOSIS — K0889 Other specified disorders of teeth and supporting structures: Secondary | ICD-10-CM | POA: Diagnosis present

## 2022-09-16 MED ORDER — KETOROLAC TROMETHAMINE 60 MG/2ML IM SOLN
15.0000 mg | Freq: Once | INTRAMUSCULAR | Status: AC
  Administered 2022-09-16: 15 mg via INTRAMUSCULAR
  Filled 2022-09-16: qty 2

## 2022-09-16 MED ORDER — IBUPROFEN 600 MG PO TABS: 600.0000 mg | ORAL_TABLET | Freq: Four times a day (QID) | ORAL | 0 refills | Status: DC | PRN

## 2022-09-16 MED ORDER — AMOXICILLIN-POT CLAVULANATE 875-125 MG PO TABS: 1.0000 | ORAL_TABLET | Freq: Two times a day (BID) | ORAL | 0 refills | Status: AC

## 2022-09-16 NOTE — ED Triage Notes (Signed)
Pt came in via POV d/t dental pain & abscess (per pt) that has been intermittently bothering her until 3 days ago it became consistent pain. From mid neck up to her temple hurts, hard to swallow & bite down on food, rates pain 10/10, A/Ox4.

## 2022-09-16 NOTE — Discharge Instructions (Addendum)
We evaluated you for your dental pain.  You likely have a bad tooth infection.  We have started you on an antibiotic.  Please call for follow-up with a dentist.  We have attached a dental resources guide to help you establish care.  You likely need a tooth extraction or root canal.  You can take 600 mg of ibuprofen every 6 hours as needed for pain.  You can also apply ice to your face.  Please keep a close eye on your symptoms.  If your symptoms worsen or do not improve with antibiotics, or you develop any swelling to your face, difficulty breathing, severe or worsening headaches, trouble swallowing, or any other concerning symptoms please return to the emergency department.

## 2022-09-16 NOTE — ED Provider Notes (Signed)
[place] EMERGENCY DEPARTMENT AT [facility] Provider Note  CSN: [id number] Arrival date & time: 09/16/22 1448  Chief Complaint(s) Abcess tooth  HPI Courtney Ashley is a 22 y.o. female without significant past medical history presenting to the emergency department with dental pain.  Patient reports pain to her left upper molar, has had pain to this tooth for some time, comes and goes.  The last 3 days she has had worsening pain.  She reports the pain radiates up and down the side of her left face.  No difficulty breathing.  Reports sore throat and mild painful swallowing, able to swallow solids and liquids without difficulty.  No neck pain or neck stiffness.  Has not noticed any fevers or chills.  Took 1 ibuprofen with some improvement.  Has not establish care with a dentist yet.   Past Medical History Past Medical History:  Diagnosis Date   ADHD (attention deficit hyperactivity disorder)    childhood   Anemia    Nexplanon in place 2018   Patient Active Problem List   Diagnosis Date Noted   Anxiety 08/14/2019   Irregular menstrual bleeding 02/22/2019   Uses contraceptive implant for birth control 02/22/2019   Home Medication(s) Prior to Admission medications   Medication Sig Start Date End Date Taking? Authorizing Provider  amoxicillin-clavulanate (AUGMENTIN) 875-125 MG tablet Take 1 tablet by mouth every 12 (twelve) hours for 10 days. 09/16/22 09/26/22 Yes Lonell Grandchild, MD  ibuprofen (ADVIL) 600 MG tablet Take 1 tablet (600 mg total) by mouth every 6 (six) hours as needed. 09/16/22  Yes Lonell Grandchild, MD  busPIRone (BUSPAR) 5 MG tablet Take 1 tablet (5 mg total) by mouth 2 (two) times daily. 08/12/19   Kallie Locks, FNP  etonogestrel (NEXPLANON) 68 MG IMPL implant 1 each by Subdermal route once.    [provider]  norethindrone-ethinyl estradiol (LOESTRIN FE) 1-20 MG-MCG tablet Take 1 tablet by mouth daily. 08/12/19   Kallie Locks, FNP                                                                                                                                     Past Surgical History Past Surgical History:  Procedure Laterality Date   TONSILLECTOMY AND ADENOIDECTOMY     Family History Family History  Problem Relation Age of Onset   Diabetes Maternal Grandmother     Social History Social History   Tobacco Use   Smoking status: Never   Smokeless tobacco: Never  Vaping Use   Vaping Use: Never used  Substance Use Topics   Alcohol use: Not Currently   Drug use: Yes    Types: Marijuana   Allergies Fish allergy  Review of Systems Review of Systems  All other systems reviewed and are negative.   Physical Exam Vital Signs  I have reviewed the triage vital signs BP (!) 142/104   Pulse 65  Temp (!) 100.8 F (38.2 C)   Resp 16   SpO2 100%  Physical Exam Vitals and nursing note reviewed.  Constitutional:      General: She is not in acute distress.    Appearance: She is well-developed.  HENT:     Head: Normocephalic and atraumatic.     Mouth/Throat:     Mouth: Mucous membranes are moist.     Comments: Tenderness to percussion to tooth #15 with poor dentition, no large periapical abscess, floor of mouth soft.  Uvula midline.  No tongue swelling.  No tonsillar swelling.  Posterior pharynx normal.  No external facial swelling. Eyes:     Pupils: Pupils are equal, round, and reactive to light.  Cardiovascular:     Rate and Rhythm: Normal rate and regular rhythm.     Heart sounds: No murmur heard. Pulmonary:     Effort: Pulmonary effort is normal. No respiratory distress.     Breath sounds: Normal breath sounds.  Abdominal:     General: Abdomen is flat.     Palpations: Abdomen is soft.     Tenderness: There is no abdominal tenderness.  Musculoskeletal:        General: No tenderness.     Cervical back: Neck supple. No rigidity.     Right lower leg: No edema.     Left lower leg: No edema.  Lymphadenopathy:      Cervical: Cervical adenopathy (left submandibular) present.  Skin:    General: Skin is warm and dry.  Neurological:     General: No focal deficit present.     Mental Status: She is alert. Mental status is at baseline.  Psychiatric:        Mood and Affect: Mood normal.        Behavior: Behavior normal.     ED Results and Treatments Labs (all labs ordered are listed, but only abnormal results are displayed) Labs Reviewed - No data to display                                                                                                                        Radiology No results found.  Pertinent labs & imaging results that were available during my care of the patient were reviewed by me and considered in my medical decision making (see MDM for details).  Medications Ordered in ED Medications  ketorolac (TORADOL) injection 15 mg (15 mg Intramuscular Given 09/16/22 0937)  Procedures Procedures  (including critical care time)  Medical Decision Making / ED Course   MDM:  22 year old female presenting to the emergency department with dental pain.  On exam, patient has specific tooth with tenderness to percussion and poor dentition.  No obvious drainable abscess noted on exam.  No evidence of Ludwig's angina or peritonsillar abscess.  Very low concern for deep space neck infection.  Neck supple.  Patient in no respiratory distress and tolerating secretions.  Vitals notable for fever likely due to infection, patient denies other infectious symptoms such as dysuria, cough, abdominal pain, nausea vomiting or diarrhea.  Will treat with Augmentin.  Provided dental resources for referral to dentistry for further treatment. Will discharge patient to home. All questions answered. Patient comfortable with plan of discharge. Return precautions discussed with patient  and specified on the after visit summary.        Medicines ordered and prescription drug management: Meds ordered this encounter  Medications   ketorolac (TORADOL) injection 15 mg   amoxicillin-clavulanate (AUGMENTIN) 875-125 MG tablet    Sig: Take 1 tablet by mouth every 12 (twelve) hours for 10 days.    Dispense:  20 tablet    Refill:  0   ibuprofen (ADVIL) 600 MG tablet    Sig: Take 1 tablet (600 mg total) by mouth every 6 (six) hours as needed.    Dispense:  30 tablet    Refill:  0    -I have reviewed the patients home medicines and have made adjustments as needed   Social Determinants of Health:  Diagnosis or treatment significantly limited by social determinants of health: marijuana use   Reevaluation: After the interventions noted above, I reevaluated the patient and found that their symptoms have improved  Co morbidities that complicate the patient evaluation  Past Medical History:  Diagnosis Date   ADHD (attention deficit hyperactivity disorder)    childhood   Anemia    Nexplanon in place 2018      Dispostion: Disposition decision including need for hospitalization was considered, and patient discharged from emergency department.    Final Clinical Impression(s) / ED Diagnoses Final diagnoses:  Tooth infection     This chart was dictated using voice recognition software.  Despite best efforts to proofread,  errors can occur which can change the documentation meaning.    Lonell Grandchild, MD 09/16/22 [phone]

## 2022-09-18 ENCOUNTER — Other Ambulatory Visit: Payer: Self-pay

## 2022-09-18 ENCOUNTER — Emergency Department (HOSPITAL_COMMUNITY)
Admission: EM | Admit: 2022-09-18 | Discharge: 2022-09-19 | Disposition: A | Discharge status: Home / Self Care | Payer: Medicaid Other | Attending: Emergency Medicine | Admitting: Emergency Medicine

## 2022-09-18 ENCOUNTER — Encounter (HOSPITAL_COMMUNITY): Payer: Self-pay

## 2022-09-18 DIAGNOSIS — Y907 Blood alcohol level of 200-239 mg/100 ml: Secondary | ICD-10-CM | POA: Diagnosis not present

## 2022-09-18 DIAGNOSIS — F1290 Cannabis use, unspecified, uncomplicated: Secondary | ICD-10-CM | POA: Insufficient documentation

## 2022-09-18 DIAGNOSIS — F23 Brief psychotic disorder: Secondary | ICD-10-CM | POA: Diagnosis not present

## 2022-09-18 DIAGNOSIS — K0889 Other specified disorders of teeth and supporting structures: Secondary | ICD-10-CM | POA: Diagnosis present

## 2022-09-18 LAB — URINALYSIS, W/ REFLEX TO CULTURE (INFECTION SUSPECTED)
Bacteria, UA: NONE SEEN
Bilirubin Urine: NEGATIVE
Glucose, UA: NEGATIVE mg/dL
Hgb urine dipstick: NEGATIVE
Ketones, ur: NEGATIVE mg/dL
Nitrite: NEGATIVE
Protein, ur: NEGATIVE mg/dL
Specific Gravity, Urine: 1.003 — ABNORMAL LOW (ref 1.005–1.030)
pH: 5 (ref 5.0–8.0)

## 2022-09-18 LAB — COMPREHENSIVE METABOLIC PANEL
ALT: 8 U/L (ref 0–44)
AST: 18 U/L (ref 15–41)
Albumin: 3.3 g/dL — ABNORMAL LOW (ref 3.5–5.0)
Alkaline Phosphatase: 28 U/L — ABNORMAL LOW (ref 38–126)
Anion gap: 11 (ref 5–15)
BUN: 8 mg/dL (ref 6–20)
CO2: 21 mmol/L — ABNORMAL LOW (ref 22–32)
Calcium: 8.8 mg/dL — ABNORMAL LOW (ref 8.9–10.3)
Chloride: 107 mmol/L (ref 98–111)
Creatinine, Ser: 0.93 mg/dL (ref 0.44–1.00)
GFR, Estimated: >60 mL/min (ref >60)
Glucose, Bld: 98 mg/dL (ref 70–99)
Potassium: 3.6 mmol/L (ref 3.5–5.1)
Sodium: 139 mmol/L (ref 135–145)
Total Bilirubin: 0.7 mg/dL (ref 0.3–1.2)
Total Protein: 5.6 g/dL — ABNORMAL LOW (ref 6.5–8.1)

## 2022-09-18 LAB — CBC WITH DIFFERENTIAL/PLATELET
Abs Immature Granulocytes: 0.04 10*3/uL (ref 0.00–0.07)
Basophils Absolute: 0 10*3/uL (ref 0.0–0.1)
Basophils Relative: 0 %
Eosinophils Absolute: 0.1 10*3/uL (ref 0.0–0.5)
Eosinophils Relative: 1 %
HCT: 36.6 % (ref 36.0–46.0)
Hemoglobin: 12 g/dL (ref 12.0–15.0)
Immature Granulocytes: 0 %
Lymphocytes Relative: 47 %
Lymphs Abs: 4.5 10*3/uL — ABNORMAL HIGH (ref 0.7–4.0)
MCH: 28 pg (ref 26.0–34.0)
MCHC: 32.8 g/dL (ref 30.0–36.0)
MCV: 85.3 fL (ref 80.0–100.0)
Monocytes Absolute: 0.5 10*3/uL (ref 0.1–1.0)
Monocytes Relative: 5 %
Neutro Abs: 4.4 10*3/uL (ref 1.7–7.7)
Neutrophils Relative %: 47 %
Platelets: 209 10*3/uL (ref 150–400)
RBC: 4.29 MIL/uL (ref 3.87–5.11)
RDW: 14.8 % (ref 11.5–15.5)
WBC: 9.4 10*3/uL (ref 4.0–10.5)
nRBC: 0 % (ref 0.0–0.2)

## 2022-09-18 LAB — RAPID URINE DRUG SCREEN, HOSP PERFORMED
Amphetamines: NOT DETECTED
Barbiturates: NOT DETECTED
Benzodiazepines: NOT DETECTED
Cocaine: NOT DETECTED
Opiates: NOT DETECTED
Tetrahydrocannabinol: POSITIVE — AB

## 2022-09-18 LAB — ACETAMINOPHEN LEVEL: Acetaminophen (Tylenol), Serum: <10 ug/mL — ABNORMAL LOW (ref 10–30)

## 2022-09-18 LAB — ETHANOL: Alcohol, Ethyl (B): 221 mg/dL — ABNORMAL HIGH (ref <10)

## 2022-09-18 LAB — SALICYLATE LEVEL: Salicylate Lvl: 13 mg/dL (ref 7.0–30.0)

## 2022-09-18 MED ORDER — LORAZEPAM 2 MG/ML IJ SOLN
INTRAMUSCULAR | Status: AC
  Filled 2022-09-18: qty 3

## 2022-09-18 MED ORDER — HALOPERIDOL LACTATE 5 MG/ML IJ SOLN
2.0000 mg | Freq: Once | INTRAMUSCULAR | Status: AC
  Administered 2022-09-18: 2 mg via INTRAVENOUS

## 2022-09-18 MED ORDER — HALOPERIDOL LACTATE 5 MG/ML IJ SOLN
5.0000 mg | Freq: Once | INTRAMUSCULAR | Status: DC
  Filled 2022-09-18: qty 1

## 2022-09-18 NOTE — ED Triage Notes (Signed)
Pt [facility] w/ c/o ETOH intox. As per report pt was unable to ambulate on scene.

## 2022-09-18 NOTE — ED Provider Notes (Signed)
[place] EMERGENCY DEPARTMENT AT [facility] Provider Note   CSN: [id number] Arrival date & time: 09/18/22  2142     History {Add pertinent medical, surgical, social history, OB history to HPI:1} Chief Complaint  Patient presents with   Alcohol Intoxication    Courtney Ashley is a 22 y.o. female.  HPI   Without significant medical history presenting with complaints of alcohol intoxication.  Patient is a difficult historian, she appears intoxicated, she was running around the room.  she tells me that she here for dental pain, she is unable to tell much information, states that her tooth has been hard for last few days, denies any trauma to the area, endorses difficulty swallowing but denies any shortness of breath, no fevers or chills.   Reviewed patient's chart was just seen 2 days ago for dental pain, started on Augmentin and was discharged.  Home Medications Prior to Admission medications   Medication Sig Start Date End Date Taking? Authorizing Provider  amoxicillin-clavulanate (AUGMENTIN) 875-125 MG tablet Take 1 tablet by mouth every 12 (twelve) hours for 10 days. 09/16/22 09/26/22  Lonell Grandchild, MD  busPIRone (BUSPAR) 5 MG tablet Take 1 tablet (5 mg total) by mouth 2 (two) times daily. 08/12/19   Kallie Locks, FNP  etonogestrel (NEXPLANON) 68 MG IMPL implant 1 each by Subdermal route once.    [provider]  ibuprofen (ADVIL) 600 MG tablet Take 1 tablet (600 mg total) by mouth every 6 (six) hours as needed. 09/16/22   Lonell Grandchild, MD  norethindrone-ethinyl estradiol (LOESTRIN FE) 1-20 MG-MCG tablet Take 1 tablet by mouth daily. 08/12/19   Kallie Locks, FNP      Allergies    Fish allergy    Review of Systems   Review of Systems  Constitutional:  Negative for chills and fever.  HENT:  Positive for dental problem.   Respiratory:  Negative for shortness of breath.   Cardiovascular:  Negative for chest pain.  Gastrointestinal:  Negative  for abdominal pain.  Neurological:  Negative for headaches.    Physical Exam Updated Vital Signs BP 119/71 (BP Location: Right Arm)   Pulse 76   Temp 98.5 F (36.9 C) (Oral)   Resp 20   Ht  (1.626 m)   Wt 65.8 kg   SpO2 100%   BMI 24.89 kg/m  Physical Exam Vitals and nursing note reviewed.  Constitutional:      General: She is not in acute distress.    Appearance: She is not ill-appearing.  HENT:     Head: Normocephalic and atraumatic.     Comments: There is no no deformity of the head present no raccoon eyes Battle sign noted.    Nose: No congestion.     Mouth/Throat:     Mouth: Mucous membranes are moist.     Pharynx: Oropharynx is clear.     Comments: No trismus no torticollis no oral edema present tongue uvula midline controlling oral secretions tonsils both equal and symmetrical bilaterally no submandibular swelling.  No obvious dental infection present on my examination  Eyes:     Conjunctiva/sclera: Conjunctivae normal.  Cardiovascular:     Rate and Rhythm: Normal rate and regular rhythm.     Pulses: Normal pulses.  Pulmonary:     Effort: Pulmonary effort is normal.  Musculoskeletal:     Comments: Spine was palpated nontender no crepitus deformities noted, no pelvis instability no leg shortening.  Moving her upper and lower extremities  without difficulty.  Skin:    General: Skin is warm and dry.  Neurological:     Mental Status: She is alert.     Comments: No facial asymmetry no difficulty with word finding following two-step commands there is no unilateral weakness present.  Psychiatric:        Mood and Affect: Mood normal.     Comments: Appears to be intoxicated and hyperactive,  denies any suicidal or homicidal ideations.     ED Results / Procedures / Treatments   Labs (all labs ordered are listed, but only abnormal results are displayed) Labs Reviewed  COMPREHENSIVE METABOLIC PANEL  CBC WITH DIFFERENTIAL/PLATELET  ETHANOL  RAPID URINE DRUG  SCREEN, HOSP PERFORMED  URINALYSIS, W/ REFLEX TO CULTURE (INFECTION SUSPECTED)  ACETAMINOPHEN LEVEL  SALICYLATE LEVEL    EKG None  Radiology No results found.  Procedures Procedures  {Document cardiac monitor, telemetry assessment procedure when appropriate:1}  Medications Ordered in ED Medications  haloperidol lactate (HALDOL) injection 2 mg (2 mg Intravenous Given 09/18/22 2222)    ED Course/ Medical Decision Making/ A&P   {   Click here for ABCD2, HEART and other calculatorsREFRESH Note before signing :1}                          Medical Decision Making Amount and/or Complexity of Data Reviewed Labs: ordered.  Risk Prescription drug management.   This patient presents to the ED for concern of dental pain, this involves an extensive number of treatment options, and is a complaint that carries with it a high risk of complications and morbidity.  The differential diagnosis includes Ludwig angina, retropharyngeal abscess, periorbital cellulitis, metabolic derailment, psychiatric emergency    Additional history obtained:  Additional history obtained from N/A External records from outside source obtained and reviewed including recent ER notes   Co morbidities that complicate the patient evaluation  N/A  Social Determinants of Health:  No primary care provider    Lab Tests:  I Ordered, and personally interpreted labs.  The pertinent results include:  ***   Imaging Studies ordered:  I ordered imaging studies including ***  I independently visualized and interpreted imaging which showed *** I agree with the radiologist interpretation   Cardiac Monitoring:  The patient was maintained on a cardiac monitor.  I personally viewed and interpreted the cardiac monitored which showed an underlying rhythm of: ***   Medicines ordered and prescription drug management:  I ordered medication including ***  for ***  I have reviewed the patients home medicines and  have made adjustments as needed  Critical Interventions:  Patient was given Haldol IV and placed in restraints Reassessed the patient she is resting comfortably, vital signs reassuring will continue to monitor   Reevaluation:  Patient hyperactive, and is running around the department, saying provocative statements to patient and staff, she is unable to be redirectable, she is is a danger to herself and others.  Will chemically restrained and apply nonviolent restraints.  Due to bizarre behavior without history of psychiatric disorder, will obtain CT head for rule out of possible mass versus bleed, will also obtain CT C-spine as I cannot clear as she appears to be intoxicated, will additional lab work for further evaluation    Consultations Obtained:  I requested consultation with the ***,  and discussed lab and imaging findings as well as pertinent plan - they recommend: ***    Test Considered:  ***    Rule  out ****    Dispostion and problem list  After consideration of the diagnostic results and the patients response to treatment, I feel that the patent would benefit from ***.       {Document critical care time when appropriate:1} {Document review of labs and clinical decision tools ie heart score, Chads2Vasc2 etc:1}  {Document your independent review of radiology images, and any outside records:1} {Document your discussion with family members, caretakers, and with consultants:1} {Document social determinants of health affecting pt's care:1} {Document your decision making why or why not admission, treatments were needed:1} Final Clinical Impression(s) / ED Diagnoses Final diagnoses:  None    Rx / DC Orders ED Discharge Orders     None

## 2022-09-19 ENCOUNTER — Emergency Department (HOSPITAL_COMMUNITY): Payer: Medicaid Other

## 2022-09-19 MED ORDER — SODIUM CHLORIDE 0.9 % IV BOLUS
1000.0000 mL | Freq: Once | INTRAVENOUS | Status: AC
  Administered 2022-09-19: 1000 mL via INTRAVENOUS

## 2022-09-19 NOTE — Discharge Instructions (Signed)
Lab work imaging were reassuring, given you resources for outpatient evaluation if you need assistance with alcohol sensation  Please follow-up your PCP for further evaluation  Come back to the emergency department if you develop chest pain, shortness of breath, severe abdominal pain, uncontrolled nausea, vomiting, diarrhea.

## 2023-02-23 ENCOUNTER — Emergency Department (HOSPITAL_COMMUNITY)
Admission: EM | Admit: 2023-02-23 | Discharge: 2023-02-23 | Disposition: A | Discharge status: Home / Self Care | Payer: No Typology Code available for payment source

## 2023-02-23 ENCOUNTER — Encounter (HOSPITAL_COMMUNITY): Payer: Self-pay | Admitting: Emergency Medicine

## 2023-02-23 ENCOUNTER — Ambulatory Visit (HOSPITAL_COMMUNITY)
Admission: EM | Admit: 2023-02-23 | Discharge: 2023-02-24 | Disposition: A | Discharge status: Other Acute Inpatient Hospital | Payer: No Typology Code available for payment source | Source: Home / Self Care

## 2023-02-23 ENCOUNTER — Other Ambulatory Visit: Payer: Self-pay

## 2023-02-23 DIAGNOSIS — I309 Acute pericarditis, unspecified: Secondary | ICD-10-CM | POA: Diagnosis not present

## 2023-02-23 DIAGNOSIS — N390 Urinary tract infection, site not specified: Secondary | ICD-10-CM

## 2023-02-23 DIAGNOSIS — I319 Disease of pericardium, unspecified: Secondary | ICD-10-CM | POA: Insufficient documentation

## 2023-02-23 DIAGNOSIS — G4700 Insomnia, unspecified: Secondary | ICD-10-CM

## 2023-02-23 DIAGNOSIS — F319 Bipolar disorder, unspecified: Secondary | ICD-10-CM | POA: Insufficient documentation

## 2023-02-23 DIAGNOSIS — F22 Delusional disorders: Secondary | ICD-10-CM | POA: Insufficient documentation

## 2023-02-23 DIAGNOSIS — F1290 Cannabis use, unspecified, uncomplicated: Secondary | ICD-10-CM | POA: Insufficient documentation

## 2023-02-23 DIAGNOSIS — R079 Chest pain, unspecified: Secondary | ICD-10-CM | POA: Diagnosis present

## 2023-02-23 DIAGNOSIS — F29 Unspecified psychosis not due to a substance or known physiological condition: Secondary | ICD-10-CM

## 2023-02-23 LAB — CBC WITH DIFFERENTIAL/PLATELET
Abs Immature Granulocytes: 0.01 10*3/uL (ref 0.00–0.07)
Basophils Absolute: 0 10*3/uL (ref 0.0–0.1)
Basophils Relative: 1 %
Eosinophils Absolute: 0.1 10*3/uL (ref 0.0–0.5)
Eosinophils Relative: 1 %
HCT: 41.5 % (ref 36.0–46.0)
Hemoglobin: 13.7 g/dL (ref 12.0–15.0)
Immature Granulocytes: 0 %
Lymphocytes Relative: 47 %
Lymphs Abs: 3.1 10*3/uL (ref 0.7–4.0)
MCH: 27.8 pg (ref 26.0–34.0)
MCHC: 33 g/dL (ref 30.0–36.0)
MCV: 84.2 fL (ref 80.0–100.0)
Monocytes Absolute: 0.6 10*3/uL (ref 0.1–1.0)
Monocytes Relative: 10 %
Neutro Abs: 2.7 10*3/uL (ref 1.7–7.7)
Neutrophils Relative %: 41 %
Platelets: 279 10*3/uL (ref 150–400)
RBC: 4.93 MIL/uL (ref 3.87–5.11)
RDW: 14.9 % (ref 11.5–15.5)
WBC: 6.6 10*3/uL (ref 4.0–10.5)
nRBC: 0 % (ref 0.0–0.2)

## 2023-02-23 LAB — PREGNANCY, URINE: Preg Test, Ur: NEGATIVE

## 2023-02-23 LAB — COMPREHENSIVE METABOLIC PANEL
ALT: 13 U/L (ref 0–44)
AST: 15 U/L (ref 15–41)
Albumin: 4.1 g/dL (ref 3.5–5.0)
Alkaline Phosphatase: 32 U/L — ABNORMAL LOW (ref 38–126)
Anion gap: 13 (ref 5–15)
BUN: 14 mg/dL (ref 6–20)
CO2: 18 mmol/L — ABNORMAL LOW (ref 22–32)
Calcium: 9.4 mg/dL (ref 8.9–10.3)
Chloride: 106 mmol/L (ref 98–111)
Creatinine, Ser: 0.86 mg/dL (ref 0.44–1.00)
GFR, Estimated: >60 mL/min (ref >60)
Glucose, Bld: 89 mg/dL (ref 70–99)
Potassium: 3.7 mmol/L (ref 3.5–5.1)
Sodium: 137 mmol/L (ref 135–145)
Total Bilirubin: 0.8 mg/dL (ref 0.3–1.2)
Total Protein: 6.8 g/dL (ref 6.5–8.1)

## 2023-02-23 LAB — URINALYSIS, ROUTINE W REFLEX MICROSCOPIC
Bilirubin Urine: NEGATIVE
Glucose, UA: NEGATIVE mg/dL
Ketones, ur: 5 mg/dL — AB
Nitrite: POSITIVE — AB
Protein, ur: 100 mg/dL — AB
RBC / HPF: >50 RBC/hpf (ref 0–5)
Specific Gravity, Urine: 1.03 (ref 1.005–1.030)
pH: 5 (ref 5.0–8.0)

## 2023-02-23 LAB — TROPONIN I (HIGH SENSITIVITY): Troponin I (High Sensitivity): 8 ng/L (ref <18)

## 2023-02-23 LAB — LIPASE, BLOOD: Lipase: 32 U/L (ref 11–51)

## 2023-02-23 LAB — RAPID URINE DRUG SCREEN, HOSP PERFORMED
Amphetamines: NOT DETECTED
Barbiturates: NOT DETECTED
Benzodiazepines: NOT DETECTED
Cocaine: NOT DETECTED
Opiates: NOT DETECTED
Tetrahydrocannabinol: POSITIVE — AB

## 2023-02-23 LAB — ETHANOL: Alcohol, Ethyl (B): <10 mg/dL (ref <10)

## 2023-02-23 MED ORDER — CEPHALEXIN 250 MG PO CAPS
500.0000 mg | ORAL_CAPSULE | Freq: Four times a day (QID) | ORAL | Status: DC
  Administered 2023-02-23 (×2): 500 mg via ORAL
  Filled 2023-02-23 (×2): qty 2

## 2023-02-23 MED ORDER — ACETAMINOPHEN 325 MG PO TABS: 650.0000 mg | ORAL_TABLET | Freq: Four times a day (QID) | ORAL | Status: DC | PRN

## 2023-02-23 MED ORDER — HYDROXYZINE HCL 25 MG PO TABS: 25.0000 mg | ORAL_TABLET | Freq: Three times a day (TID) | ORAL | Status: DC | PRN

## 2023-02-23 MED ORDER — ALUM & MAG HYDROXIDE-SIMETH 200-200-20 MG/5ML PO SUSP: 30.0000 mL | ORAL | Status: DC | PRN

## 2023-02-23 MED ORDER — OLANZAPINE 5 MG PO TBDP
5.0000 mg | ORAL_TABLET | Freq: Every day | ORAL | Status: DC
  Administered 2023-02-23: 5 mg via ORAL
  Filled 2023-02-23: qty 1

## 2023-02-23 MED ORDER — NAPROXEN 500 MG PO TABS: 500.0000 mg | ORAL_TABLET | Freq: Two times a day (BID) | ORAL | 0 refills | Status: DC

## 2023-02-23 MED ORDER — MAGNESIUM HYDROXIDE 400 MG/5ML PO SUSP: 30.0000 mL | Freq: Every day | ORAL | Status: DC | PRN

## 2023-02-23 MED ORDER — NAPROXEN 500 MG PO TABS: 500.0000 mg | ORAL_TABLET | Freq: Two times a day (BID) | ORAL | Status: DC | PRN

## 2023-02-23 MED ORDER — CEPHALEXIN 500 MG PO CAPS: 500.0000 mg | ORAL_CAPSULE | Freq: Four times a day (QID) | ORAL | 0 refills | Status: DC

## 2023-02-23 MED ORDER — TRAZODONE HCL 50 MG PO TABS
50.0000 mg | ORAL_TABLET | Freq: Every evening | ORAL | Status: DC | PRN
  Administered 2023-02-23: 50 mg via ORAL
  Filled 2023-02-23: qty 1

## 2023-02-23 NOTE — ED Provider Notes (Signed)
[place] EMERGENCY DEPARTMENT AT [facility] Provider Note   CSN: [id number] Arrival date & time: 02/23/23  0915     History  Chief Complaint  Patient presents with   Chest Pain    Courtney Ashley is a 22 y.o. female.  The history is provided by the patient and medical records. No language interpreter was used.  Chest Pain    Patient is a 22 year old female presenting with a chief complaint of chest pain.  She states that this began this morning when she was walking.  States the pain is non radiating and both left and right sided. She also reports shortness of breath that began along with the chest pain. She denies and headache, dizziness, nausea, or vomiting.  Also denies any recent car/plane rides or any calf pain/swelling. Patient also reports that she has been awake for the past two days.  She denies any increase in energy but states that "she just cannot sleep." She denies and HI/SI or any auditory/visual hallucinations. Also states that she feels her "though pattern is different." She does not currently take any medications.   Home Medications Prior to Admission medications   Medication Sig Start Date End Date Taking? Authorizing Provider  busPIRone (BUSPAR) 5 MG tablet Take 1 tablet (5 mg total) by mouth 2 (two) times daily. 08/12/19   Kallie Locks, FNP  etonogestrel (NEXPLANON) 68 MG IMPL implant 1 each by Subdermal route once.    [provider]  ibuprofen (ADVIL) 600 MG tablet Take 1 tablet (600 mg total) by mouth every 6 (six) hours as needed. 09/16/22   Lonell Grandchild, MD  norethindrone-ethinyl estradiol (LOESTRIN FE) 1-20 MG-MCG tablet Take 1 tablet by mouth daily. 08/12/19   Kallie Locks, FNP      Allergies    Fish allergy    Review of Systems   Review of Systems  Cardiovascular:  Positive for chest pain.  All other systems reviewed and are negative.   Physical Exam Updated Vital Signs BP (!) 133/102 (BP Location: Left Arm)   Pulse  78   Temp 99.6 F (37.6 C) (Oral)   Ht 5\' 4"  (1.626 m)   Wt 66 kg   SpO2 99%   BMI 24.98 kg/m  Physical Exam Vitals and nursing note reviewed.  Constitutional:      General: She is not in acute distress.    Appearance: She is well-developed.  HENT:     Head: Atraumatic.  Eyes:     Conjunctiva/sclera: Conjunctivae normal.  Cardiovascular:     Rate and Rhythm: Normal rate and regular rhythm.     Pulses: Normal pulses.     Heart sounds: Normal heart sounds.  Pulmonary:     Effort: Pulmonary effort is normal.  Abdominal:     Palpations: Abdomen is soft.     Tenderness: There is no abdominal tenderness.  Musculoskeletal:     Cervical back: Neck supple.  Skin:    Findings: No rash.  Neurological:     Mental Status: She is alert.  Psychiatric:        Attention and Perception: Attention normal.        Mood and Affect: Mood normal.        Speech: Speech normal.        Behavior: Behavior is cooperative.        Thought Content: Thought content is not paranoid. Thought content does not include homicidal or suicidal ideation.     ED  Results / Procedures / Treatments   Labs (all labs ordered are listed, but only abnormal results are displayed) Labs Reviewed  COMPREHENSIVE METABOLIC PANEL - Abnormal; Notable for the following components:      Result Value   CO2 18 (*)    Alkaline Phosphatase 32 (*)    All other components within normal limits  URINALYSIS, ROUTINE W REFLEX MICROSCOPIC - Abnormal; Notable for the following components:   Color, Urine AMBER (*)    APPearance CLOUDY (*)    Hgb urine dipstick LARGE (*)    Ketones, ur 5 (*)    Protein, ur 100 (*)    Nitrite POSITIVE (*)    Leukocytes,Ua TRACE (*)    Bacteria, UA MANY (*)    All other components within normal limits  RAPID URINE DRUG SCREEN, HOSP PERFORMED - Abnormal; Notable for the following components:   Tetrahydrocannabinol POSITIVE (*)    All other components within normal limits  CBC WITH  DIFFERENTIAL/PLATELET  LIPASE, BLOOD  PREGNANCY, URINE  ETHANOL  TROPONIN I (HIGH SENSITIVITY)    EKG None ED ECG REPORT   Date: 02/23/2023  Rate: 67  Rhythm: normal sinus rhythm  QRS Axis: normal  Intervals: normal  ST/T Wave abnormalities: nonspecific ST changes  Conduction Disutrbances:none  Narrative Interpretation: ST elevation suggest acute pericarditis  Old EKG Reviewed: none available  I have personally reviewed the EKG tracing and agree with the computerized printout as noted.   Radiology No results found.  Procedures Procedures    Medications Ordered in ED Medications - No data to display  ED Course/ Medical Decision Making/ A&P                                 Medical Decision Making Amount and/or Complexity of Data Reviewed Labs: ordered.   BP (!) 133/102 (BP Location: Left Arm)   Pulse 78   Temp 99.6 F (37.6 C) (Oral)   Ht 5\' 4"  (1.626 m)   Wt 66 kg   SpO2 99%   BMI 24.98 kg/m   10:09 AM Patient is a 22 year old female presenting with a chief complaint of chest pain.  She states that this began this morning when she was walking.  States the pain is non radiating and both left and right sided. She also reports shortness of breath that began along with the chest pain. She denies and headache, dizziness, nausea, or vomiting.  Also denies any recent car/plane rides or any calf pain/swelling. Patient also reports that she has been awake for the past two days.  She denies any increase in energy but states that "she just cannot sleep." She denies and HI/SI or any auditory/visual hallucinations. Also states that she feels her "though pattern is different." She does not currently take any medications.   On exam, patient is resting comfortably in bed appears to be in no acute discomfort.  Heart with normal rate and rhythm, lungs are clear to auscultation bilaterally abdomen is soft nontender patient is alert and oriented x 4 and she does not appear to be  responding to any internal stimuli.  She is mentating appropriately.  -Labs ordered, independently viewed and interpreted by me.  Labs remarkable for UA showing UTI. Will treat with keflex.  Normal trop, doubt MACE.  HEART score of 1 -The patient was maintained on a cardiac monitor.  I personally viewed and interpreted the cardiac monitored which showed an underlying  rhythm of: NSR, diffused ST elevation suggest pericarditis -Imaging including CXR considered but not performed as pt request to leave soon before xray can be done -This patient presents to the ED for concern of chest pain and trouble sleeping, this involves an extensive number of treatment options, and is a complaint that carries with it a high risk of complications and morbidity.  The differential diagnosis includes pericarditis, GERD, gastritis, MI, PTX, PNA, MSK, depression, electrolytes derangement, infection, psych illness -Co morbidities that complicate the patient evaluation includes anxiety, anemia -Treatment includes reassurance -Reevaluation of the patient after these medicines showed that the patient improved -PCP office notes or outside notes reviewed -Escalation to admission/observation considered: patients feels much better, is comfortable with discharge, and will follow up with cardiology -Prescription medication considered, patient comfortable with naproxen and keflex -Social Determinant of Health considered which includes tobacco use  12:17 PM Patient has findings concerning for urinary tract infection.  Will treat with Keflex.  She also has EKG showing diffuse ST elevation suggestive of pericarditis.  Sulfa condition may indicate that this is the case therefore I will prescribe naproxen anti-inflammatory medication as pain control and have patient follow-up closely with cardiology for outpatient evaluation.  Return precaution given.  Please note chest x-ray was not performed today.        Final Clinical  Impression(s) / ED Diagnoses Final diagnoses:  Acute pericarditis, unspecified type  Acute lower UTI  Insomnia, unspecified type    Rx / DC Orders ED Discharge Orders          Ordered    cephALEXin (KEFLEX) 500 MG capsule  4 times daily        02/23/23 1219    naproxen (NAPROSYN) 500 MG tablet  2 times daily        02/23/23 1219    Ambulatory referral to Cardiology       Comments: If you have not heard from the Cardiology office within the next 72 hours please call [phone].   02/23/23 1219              Fayrene Helper, PA-C 02/23/23 1220    Durwin Glaze, MD 02/23/23 [phone]

## 2023-02-23 NOTE — ED Notes (Signed)
Patient observed/assessed in bed/chair resting quietly appearing in no distress and verbalizing no complaints at this time. Will continue to monitor.  

## 2023-02-23 NOTE — Progress Notes (Addendum)
Pt was accepted to [facility] TOMORROW 02/24/2023; Bed Assignment Cheron Every PENDING Negative COVID Faxed to [facility]  [phone]  Pt meets inpatient criteria per Loreen Freud  Attending Physician will be Dr. Clarene Duke, MD  Report can be called to: [phone]  Pt can arrive after 7:00am  Care Team notified: Mahala Menghini [facility], LCSWA 02/23/2023 @ 10:00 PM

## 2023-02-23 NOTE — Progress Notes (Addendum)
Per [facility] Rosey Bath, RN pt can be accepted to [facility] TODAY PENDING signed voluntary consent uploaded to chart or faxed to [facility] [phone].   Pt was accepted to [facility] TODAY 02/24/2023; Bed Assignment -To be provided by [facility]  Pt meets inpatient criteria per Loreen Freud  Attending Physician will be Dr. Phineas Inches, MD   Report can be called to: -Adult unit: [phone]  Pt can arrive after: [facility] to coordinate with care team.  Care Team notified:  Parthenia Ames, [facility] Lona Kettle, Night [facility] [address] [place], [place] 02/24/2023 @ 12:32 AM

## 2023-02-23 NOTE — ED Provider Notes (Addendum)
[facility] Urgent Care Continuous Assessment Admission H&P  Date: 02/23/23 Patient Name: Courtney Ashley MRN: [id number] Chief Complaint: "something wrong with my mental."  Diagnoses:  Final diagnoses:  Psychosis, unspecified psychosis type [facility])    HPI: Courtney Ashley is a 22 year old female patient with a past psychiatric history reported as depression and bipolar who presents to the [facility] behavioral health urgent care voluntary accompanied by her grandmother and uncle for an evaluation.  Patient seen and evaluated face to face by this provider along with [facility], [facility] and chart reviewed. Per chart review, patient was evaluated at [facility] today, 02/23/23 with c/o chest pain and was treated for pericarditis and UTI.   On evaluation, patient is alert and oriented x 4. Her thought process is disorganized with delusional and paranoia thought content. She endorses visual hallucinations. She denies AH. She denies SI/HI. Her speech is tangential and pressured. Her mood is depressed and affect is congruent. She is cooperative and does not appear to be in acute distress.   Per Falencio, counselor's note, Courtney Ashley is a 22 year old female presenting to [facility] voluntarily with chief complaint of AVH. Patient reports she has not been able to sleep for the past three days and states she is having symptoms related to AVH of seeing her deceased aunt and cousin who died in a car accident about a year ago. Patient reports she went to the ED today and while in the ED she heard her deceased aunt and cousin in the next room and then they walked into her room, but they were invisible. Patient reports ongoing conversations with her deceased family members when she got home. Patient reports they were telling her things, and she was trying to help them cross to the other side. Patient reports that her deceased aunt told her not to cry because it was demons in her tears. They also told her not to eat because it was demons in  her stomach. Patient reports she has barley ate anything in the past three days. Patient reports that she lives with her mother and father however reports that her deceased cousin told her that her father shot himself in the head. Patient reports visual hallucinations of her father at night and states that he looks like the devil. Patient reports her mother told her that she could not sleep in her father spot on the bed because the demons would attack her. Patient reports she was sleep one day and it felt like she could not wake up and reports that the demons was eating her. Patient reports she has been praying and reading the Bible, but nothing has helped her. Patient reports she has not told anyone about some of the things she is going through in fear of being called crazy. Patient reports diagnosis of depression and bipolar disorder and in 2018 she went to [facility] and was prescribed Prozac for depression. Patient denies psychiatric inpatient treatment however on 09/18/2022 patient had and ED visit and was intoxicated with psychotic like symptoms. Patient stayed in the ED and returned to baseline and was ultimately discharged with recommendations to follow up with outpatient services. Patient reports she started going to [facility] but stopped going because they were worried more about her anger and that was not the problem. Patient reports worsening depressive symptoms for the past week and reports being under a lot of stress. 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 Today patient called her  aunt to tell her about the AVH and they accompanied her here today for treatment. Patient denies heavy alcohol use and reports THC about three days ago. Patient unable to express how much she has been using however reports it was daily in the past. Patient lives with her mother and father (not sure if this is biological father or stepfather) and she has  a 25-year-old daughter who lives with her aunt. Patient has not been to work in three days and fears she lost her job in [employer]. Patient denies legal issues and does not have access to firearm.      Total Time spent with patient: 30 minutes  Musculoskeletal  Strength & Muscle Tone: within normal limits Gait & Station: normal Patient leans: N/A  Psychiatric Specialty Exam  Presentation General Appearance:  Casual  Eye Contact: Fleeting  Speech: Pressured  Speech Volume: Increased  Handedness:Right   Mood and Affect  Mood: Depressed  Affect:Congruent   Thought Process  Thought Processes: Disorganized; Irrevelant  Descriptions of Associations:Tangential  Orientation:Full (Time, Place and Person)  Thought Content:Delusions; Paranoid Ideation; Scattered  Diagnosis of Schizophrenia or Schizoaffective disorder in past: No   Hallucinations:Hallucinations: Visual  Ideas of Reference:Paranoia; Delusions  Suicidal Thoughts:Suicidal Thoughts: No  Homicidal Thoughts:Homicidal Thoughts: No   Sensorium  Memory: Immediate Fair  Judgment: Poor  Insight: Poor   Executive Functions  Concentration: Poor  Attention Span: Poor  Recall: Poor  Fund of Knowledge: Fair  Language: Fair   Psychomotor Activity  Psychomotor Activity: Psychomotor Activity: Restlessness   Assets  Assets: Communication Skills; Desire for Improvement; Housing   Sleep  Sleep: Sleep: Poor   Nutritional Assessment (For OBS and FBC admissions only) Has the patient had a weight loss or gain of 10 pounds or more in the last 3 months?: No Has the patient had a decrease in food intake/or appetite?: Yes Does the patient have dental problems?: No Does the patient have eating habits or behaviors that may be indicators of an eating disorder including binging or inducing vomiting?: No Has the patient recently lost weight without trying?: 2.0 Has the patient  been eating poorly because of a decreased appetite?: 1 Malnutrition Screening Tool Score: 3    Physical Exam HENT:     Nose: Nose normal.  Eyes:     Conjunctiva/sclera: Conjunctivae normal.  Cardiovascular:     Rate and Rhythm: Normal rate.  Pulmonary:     Effort: Pulmonary effort is normal.  Musculoskeletal:        General: Normal range of motion.     Cervical back: Normal range of motion.  Neurological:     Mental Status: She is alert and oriented to person, place, and time.    Review of Systems  Constitutional: Negative.   HENT: Negative.    Eyes: Negative.   Respiratory: Negative.    Cardiovascular: Negative.   Gastrointestinal: Negative.   Genitourinary:        UTI  Musculoskeletal: Negative.   Neurological: Negative.   Endo/Heme/Allergies: Negative.   Psychiatric/Behavioral:  Positive for hallucinations. The patient has insomnia.     Blood pressure 134/87, pulse 97, temperature 99.3 F (37.4 C), temperature source Oral, resp. rate 16, SpO2 100%. There is no height or weight on file to calculate BMI.  Past Psychiatric History: A reported history of depression and bipolar.  Is the patient at risk to self? No  Has the patient been a risk to self in the past 6 months? No .  Has the patient been a risk to self within the distant past? No   Is the patient a risk to others? No   Has the patient been a risk to others in the past 6 months? No   Has the patient been a risk to others within the distant past? No   Past Medical History: None.  Family History: Per patient, her mother has a history of depression and bipolar.   Social History: Patient resides with her parents, and 31 year old daughter. Patient denies access to firearms in the home. Patient is unemployed. Patient reports occasional marijuana use. Patient reports daily nicotine use. Patient reports occasional alcohol use.   Last Labs:  Admission on 02/23/2023, Discharged on 02/23/2023  Component Date  Value Ref Range Status   WBC 02/23/2023 6.6  4.0 - 10.5 K/uL Final   RBC 02/23/2023 4.93  3.87 - 5.11 MIL/uL Final   Hemoglobin 02/23/2023 13.7  12.0 - 15.0 g/dL Final   HCT 40/98/1191 41.5  36.0 - 46.0 % Final   MCV 02/23/2023 84.2  80.0 - 100.0 fL Final   MCH 02/23/2023 27.8  26.0 - 34.0 pg Final   MCHC 02/23/2023 33.0  30.0 - 36.0 g/dL Final   RDW 47/82/9562 14.9  11.5 - 15.5 % Final   Platelets 02/23/2023 279  150 - 400 K/uL Final   nRBC 02/23/2023 0.0  0.0 - 0.2 % Final   Neutrophils Relative % 02/23/2023 41  % Final   Neutro Abs 02/23/2023 2.7  1.7 - 7.7 K/uL Final   Lymphocytes Relative 02/23/2023 47  % Final   Lymphs Abs 02/23/2023 3.1  0.7 - 4.0 K/uL Final   Monocytes Relative 02/23/2023 10  % Final   Monocytes Absolute 02/23/2023 0.6  0.1 - 1.0 K/uL Final   Eosinophils Relative 02/23/2023 1  % Final   Eosinophils Absolute 02/23/2023 0.1  0.0 - 0.5 K/uL Final   Basophils Relative 02/23/2023 1  % Final   Basophils Absolute 02/23/2023 0.0  0.0 - 0.1 K/uL Final   Immature Granulocytes 02/23/2023 0  % Final   Abs Immature Granulocytes 02/23/2023 0.01  0.00 - 0.07 K/uL Final   Performed at [facility], [address], [place], [place] [address]   Sodium 02/23/2023 137  135 - 145 mmol/L Final   Potassium 02/23/2023 3.7  3.5 - 5.1 mmol/L Final   Chloride 02/23/2023 106  98 - 111 mmol/L Final   CO2 02/23/2023 18 (L)  22 - 32 mmol/L Final   Glucose, Bld 02/23/2023 89  70 - 99 mg/dL Final   Glucose reference range applies only to samples taken after fasting for at least 8 hours.   BUN 02/23/2023 14  6 - 20 mg/dL Final   Creatinine, Ser 02/23/2023 0.86  0.44 - 1.00 mg/dL Final   Calcium 57/84/6962 9.4  8.9 - 10.3 mg/dL Final   Total Protein 95/28/4132 6.8  6.5 - 8.1 g/dL Final   Albumin 44/06/270 4.1  3.5 - 5.0 g/dL Final   AST 53/66/4403 15  15 - 41 U/L Final   ALT 02/23/2023 13  0 - 44 U/L Final   Alkaline Phosphatase 02/23/2023 32 (L)  38 - 126 U/L Final   Total  Bilirubin 02/23/2023 0.8  0.3 - 1.2 mg/dL Final   GFR, Estimated 02/23/2023 >60  >60 mL/min Final   Comment: (NOTE) Calculated using the CKD-EPI Creatinine Equation (2021)    Anion gap 02/23/2023 13  5 - 15 Final  Performed at [facility], [address], [place], [place] [address]   Lipase 02/23/2023 32  11 - 51 U/L Final   Performed at [facility], [address], [place], [place] [address]   Color, Urine 02/23/2023 AMBER (A)  YELLOW Final   BIOCHEMICALS MAY BE AFFECTED BY COLOR   APPearance 02/23/2023 CLOUDY (A)  CLEAR Final   Specific Gravity, Urine 02/23/2023 1.030  1.005 - 1.030 Final   pH 02/23/2023 5.0  5.0 - 8.0 Final   Glucose, UA 02/23/2023 NEGATIVE  NEGATIVE mg/dL Final   Hgb urine dipstick 02/23/2023 LARGE (A)  NEGATIVE Final   Bilirubin Urine 02/23/2023 NEGATIVE  NEGATIVE Final   Ketones, ur 02/23/2023 5 (A)  NEGATIVE mg/dL Final   Protein, ur 36/64/4034 100 (A)  NEGATIVE mg/dL Final   Nitrite 74/25/9563 POSITIVE (A)  NEGATIVE Final   Leukocytes,Ua 02/23/2023 TRACE (A)  NEGATIVE Final   RBC / HPF 02/23/2023 >50  0 - 5 RBC/hpf Final   WBC, UA 02/23/2023 21-50  0 - 5 WBC/hpf Final   Bacteria, UA 02/23/2023 MANY (A)  NONE SEEN Final   Squamous Epithelial / HPF 02/23/2023 0-5  0 - 5 /HPF Final   Mucus 02/23/2023 PRESENT   Final   Performed at [facility], [address] [place], [place] [address]   Preg Test, Ur 02/23/2023 NEGATIVE  NEGATIVE Final   Comment:        THE SENSITIVITY OF THIS METHODOLOGY IS >25 mIU/mL. Performed at [facility], [address], [place], [place] [address]    Opiates 02/23/2023 NONE DETECTED  NONE DETECTED Final   Cocaine 02/23/2023 NONE DETECTED  NONE DETECTED Final   Benzodiazepines 02/23/2023 NONE DETECTED  NONE DETECTED Final   Amphetamines 02/23/2023 NONE DETECTED  NONE DETECTED Final   Tetrahydrocannabinol 02/23/2023 POSITIVE (A)  NONE DETECTED Final   Barbiturates 02/23/2023 NONE DETECTED  NONE DETECTED  Final   Comment: (NOTE) DRUG SCREEN FOR MEDICAL PURPOSES ONLY.  IF CONFIRMATION IS NEEDED FOR ANY PURPOSE, NOTIFY LAB WITHIN 5 DAYS.  LOWEST DETECTABLE LIMITS FOR URINE DRUG SCREEN Drug Class                     Cutoff (ng/mL) Amphetamine and metabolites    1000 Barbiturate and metabolites    200 Benzodiazepine                 200 Opiates and metabolites        300 Cocaine and metabolites        300 THC                            50 Performed at [facility], [address], [place], [place] [address]    Alcohol, Ethyl (B) 02/23/2023 <10  <10 mg/dL Final   Comment: (NOTE) Lowest detectable limit for serum alcohol is 10 mg/dL.  For medical purposes only. Performed at [facility], [address], [place], [place] [address]    Troponin I (High Sensitivity) 02/23/2023 8  <18 ng/L Final   Comment: (NOTE) Elevated high sensitivity troponin I (hsTnI) values and significant  changes across serial measurements may suggest ACS but many other  chronic and acute conditions are known to elevate hsTnI results.  Refer to the "Links" section for chest pain algorithms and additional  guidance. Performed at [facility], [address],  [place], [place] [address]   Admission on 09/18/2022, Discharged on 09/19/2022  Component Date Value Ref Range Status   Sodium 09/18/2022 139  135 - 145 mmol/L Final   Potassium 09/18/2022 3.6  3.5 - 5.1 mmol/L Final   HEMOLYSIS AT THIS LEVEL MAY AFFECT RESULT   Chloride 09/18/2022 107  98 - 111 mmol/L Final   CO2 09/18/2022 21 (L)  22 - 32 mmol/L Final   Glucose, Bld 09/18/2022 98  70 - 99 mg/dL Final   Glucose reference range applies only to samples taken after fasting for at least 8 hours.   BUN 09/18/2022 8  6 - 20 mg/dL Final   Creatinine, Ser 09/18/2022 0.93  0.44 - 1.00 mg/dL Final   Calcium 74/25/9563 8.8 (L)  8.9 - 10.3 mg/dL Final   Total Protein 87/56/4332 5.6 (L)  6.5 - 8.1 g/dL Final   Albumin 95/18/8416 3.3 (L)  3.5 -  5.0 g/dL Final   AST 60/63/0160 18  15 - 41 U/L Final   HEMOLYSIS AT THIS LEVEL MAY AFFECT RESULT   ALT 09/18/2022 8  0 - 44 U/L Final   HEMOLYSIS AT THIS LEVEL MAY AFFECT RESULT   Alkaline Phosphatase 09/18/2022 28 (L)  38 - 126 U/L Final   Total Bilirubin 09/18/2022 0.7  0.3 - 1.2 mg/dL Final   HEMOLYSIS AT THIS LEVEL MAY AFFECT RESULT   GFR, Estimated 09/18/2022 >60  >60 mL/min Final   Comment: (NOTE) Calculated using the CKD-EPI Creatinine Equation (2021)    Anion gap 09/18/2022 11  5 - 15 Final   Performed at [facility], [address] [place], [place] [address]   WBC 09/18/2022 9.4  4.0 - 10.5 K/uL Final   RBC 09/18/2022 4.29  3.87 - 5.11 MIL/uL Final   Hemoglobin 09/18/2022 12.0  12.0 - 15.0 g/dL Final   HCT 35/57/3220 36.6  36.0 - 46.0 % Final   MCV 09/18/2022 85.3  80.0 - 100.0 fL Final   MCH 09/18/2022 28.0  26.0 - 34.0 pg Final   MCHC 09/18/2022 32.8  30.0 - 36.0 g/dL Final   RDW 25/42/7062 14.8  11.5 - 15.5 % Final   Platelets 09/18/2022 209  150 - 400 K/uL Final   REPEATED TO VERIFY   nRBC 09/18/2022 0.0  0.0 - 0.2 % Final   Neutrophils Relative % 09/18/2022 47  % Final   Neutro Abs 09/18/2022 4.4  1.7 - 7.7 K/uL Final   Lymphocytes Relative 09/18/2022 47  % Final   Lymphs Abs 09/18/2022 4.5 (H)  0.7 - 4.0 K/uL Final   Monocytes Relative 09/18/2022 5  % Final   Monocytes Absolute 09/18/2022 0.5  0.1 - 1.0 K/uL Final   Eosinophils Relative 09/18/2022 1  % Final   Eosinophils Absolute 09/18/2022 0.1  0.0 - 0.5 K/uL Final   Basophils Relative 09/18/2022 0  % Final   Basophils Absolute 09/18/2022 0.0  0.0 - 0.1 K/uL Final   Immature Granulocytes 09/18/2022 0  % Final   Abs Immature Granulocytes 09/18/2022 0.04  0.00 - 0.07 K/uL Final   Performed at [facility], [address], [place], [place] [address]   Alcohol, Ethyl (B) 09/18/2022 221 (H)  <10 mg/dL Final   Comment: (NOTE) Lowest detectable limit for serum alcohol is 10 mg/dL.  For medical  purposes only. Performed at [facility], [address], [place], [place] [address]    Opiates 09/18/2022 NONE DETECTED  NONE DETECTED Final  Cocaine 09/18/2022 NONE DETECTED  NONE DETECTED Final   Benzodiazepines 09/18/2022 NONE DETECTED  NONE DETECTED Final   Amphetamines 09/18/2022 NONE DETECTED  NONE DETECTED Final   Tetrahydrocannabinol 09/18/2022 POSITIVE (A)  NONE DETECTED Final   Barbiturates 09/18/2022 NONE DETECTED  NONE DETECTED Final   Comment: (NOTE) DRUG SCREEN FOR MEDICAL PURPOSES ONLY.  IF CONFIRMATION IS NEEDED FOR ANY PURPOSE, NOTIFY LAB WITHIN 5 DAYS.  LOWEST DETECTABLE LIMITS FOR URINE DRUG SCREEN Drug Class                     Cutoff (ng/mL) Amphetamine and metabolites    1000 Barbiturate and metabolites    200 Benzodiazepine                 200 Opiates and metabolites        300 Cocaine and metabolites        300 THC                            50 Performed at [facility], [address] [place], [place] [address]    Specimen Source 09/18/2022 URINE, CLEAN CATCH   Final   Color, Urine 09/18/2022 STRAW (A)  YELLOW Final   APPearance 09/18/2022 HAZY (A)  CLEAR Final   Specific Gravity, Urine 09/18/2022 1.003 (L)  1.005 - 1.030 Final   pH 09/18/2022 5.0  5.0 - 8.0 Final   Glucose, UA 09/18/2022 NEGATIVE  NEGATIVE mg/dL Final   Hgb urine dipstick 09/18/2022 NEGATIVE  NEGATIVE Final   Bilirubin Urine 09/18/2022 NEGATIVE  NEGATIVE Final   Ketones, ur 09/18/2022 NEGATIVE  NEGATIVE mg/dL Final   Protein, ur 25/95/6387 NEGATIVE  NEGATIVE mg/dL Final   Nitrite 56/43/3295 NEGATIVE  NEGATIVE Final   Leukocytes,Ua 09/18/2022 SMALL (A)  NEGATIVE Final   RBC / HPF 09/18/2022 0-5  0 - 5 RBC/hpf Final   WBC, UA 09/18/2022 0-5  0 - 5 WBC/hpf Final   Comment:        Reflex urine culture not performed if WBC <=10, OR if Squamous epithelial cells >5. If Squamous epithelial cells >5 suggest recollection.    Bacteria, UA 09/18/2022 NONE SEEN  NONE SEEN  Final   Squamous Epithelial / HPF 09/18/2022 0-5  0 - 5 /HPF Final   Performed at [facility], [address] [place], [place] [address]   Acetaminophen (Tylenol), Serum 09/18/2022 <10 (L)  10 - 30 ug/mL Final   Comment: (NOTE) Therapeutic concentrations vary significantly. A range of 10-30 ug/mL  may be an effective concentration for many patients. However, some  are best treated at concentrations outside of this range. Acetaminophen concentrations >150 ug/mL at 4 hours after ingestion  and >50 ug/mL at 12 hours after ingestion are often associated with  toxic reactions.  Performed at [facility], [address] [place], [place] [address]    Salicylate Lvl 09/18/2022 13.0  7.0 - 30.0 mg/dL Final   Performed at [facility], [address], [place], [place] [address]    Allergies: Fish allergy  Medications:  Facility Ordered Medications  Medication   acetaminophen (TYLENOL) tablet 650 mg   alum & mag hydroxide-simeth (MAALOX/MYLANTA) 200-200-20 MG/5ML suspension 30 mL   magnesium hydroxide (MILK OF MAGNESIA) suspension 30 mL   hydrOXYzine (ATARAX) tablet 25 mg   traZODone (DESYREL) tablet 50 mg   OLANZapine zydis (ZYPREXA) disintegrating tablet 5 mg  cephALEXin (KEFLEX) capsule 500 mg   naproxen (NAPROSYN) tablet 500 mg   PTA Medications  Medication Sig   etonogestrel (NEXPLANON) 68 MG IMPL implant 1 each by Subdermal route once.   norethindrone-ethinyl estradiol (LOESTRIN FE) 1-20 MG-MCG tablet Take 1 tablet by mouth daily.   busPIRone (BUSPAR) 5 MG tablet Take 1 tablet (5 mg total) by mouth 2 (two) times daily.   ibuprofen (ADVIL) 600 MG tablet Take 1 tablet (600 mg total) by mouth every 6 (six) hours as needed.      Medical Decision Making  Patient is recommended for inpatient psychiatric treatment for psychosis. Patient is to be admitted to the [facility] behavioral health continuous assessment unit while she awaits placement. Patient is  voluntary.  Medication regimen Will initiate Zyprexa Zydis 5 mg p.o. nightly for psychosis Restart Keflex 500 mg QID for UTI Restart naproxen 500 mg twice daily as needed as needed for pericarditis  Labs Patient was seen and evaluated at the [facility] emergency department this morning 02/23/23. No need for additional labs other than adding on an A1c, TSH and A1c.    Recommendations  Based on my evaluation the patient does not appear to have an emergency medical condition.  Layla Barter, NP 02/23/23  8:35 PM
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
## 2023-02-23 NOTE — ED Triage Notes (Signed)
Pt arrives via EMS from home with reports of being up for 2 days. Denies drug use. Pt has seen a therapist but not on any psych medications. Pt woke up not feeling well and decided to walk around [employer]. Endorses chest pain today. EMS reports elevation in V4 and 5, given 324 ASA. Pt denies CP at this time, also denies any SI/HI.

## 2023-02-23 NOTE — Progress Notes (Signed)
LCSW Progress Note  [id number]   Courtney Ashley  02/23/2023  9:52 PM    Inpatient Behavioral Health Placement  Pt meets inpatient criteria per Patrice White,NP There are no available beds within [facility] system per [facility] [employer]. Referral was sent to the following facilities;   Destination  Service Provider Address Phone [facility] [place]  [address] [place], [place] [address] [phone]  [facility]  [address], [place] [address] [phone]  [facility]  [address] [place] [address] [phone]  [facility]  [address] [facility] Regino Bellow [place] [address] [phone]  [facility]  [address] [place] [address] [phone]  [facility] [address]  [address], [place] [address] [phone]  [facility]  [address] [place] [address] [phone]  [facility]  [address] [place], [place] [address] [phone]  [facility]  [address] [place] [address] [phone]  [facility]  [address] [place] [address] [phone]  [facility]  [address], [place] [address] [phone]  [facility]  [address] [place] [address] [phone]  [facility]  [address], [place] [address] [phone]  [facility]  [place] [phone]  [facility]  [address] [place] [address] [phone]  [facility]  [address] [name] [place] [phone]   [facility]  [address], [place] [address] [phone]  [facility]  [address], [place] [address] [phone]  [facility]  [address], [place] [address] [phone]  [facility]  [address] [place] [address] [phone]  [facility]  [address], [place] [address] [phone]  [facility]  [address] [place] [address] [phone]  [facility]  [address], [place] [address] [phone]  [facility]  [address] [place] [address] [phone]  [facility]  [address] [place] [address] [phone]  [facility]  [address] [place] [address] [phone]  [facility] Patient Placement  [facility], [place] [phone]  [facility]  [address] [place] [address] [phone]  [facility] [address]  [place] [address] [phone]  [facility]  [address] [place] [address] [phone]    Situation ongoing,  CSW will follow up.    Maryjean Ka, MSW, [facility] 02/23/2023 9:52 PM

## 2023-02-23 NOTE — Progress Notes (Signed)
   02/23/23 1741  [facility] Triage Screening (Walk-ins at [facility] only)  How Did You Hear About [place]? Family/Friend  What Is the Reason for Your Visit/Call Today? Pt arrived voluntarily to [facility] accompanied by family. Pt states that something is wrong with her mental, she unable to pin point what's going on. Pt denies SI, HI and alcohol/drug use at this current time. Pt states that she experiences AVH, seeing her deceased cousins and hearing their voices as well as her aunts.  How Long Has This Been Causing You Problems?  (Unsure)  Have You Recently Had Any Thoughts About Hurting Yourself? No  Are You Planning to Commit Suicide/Harm Yourself At This time? No  Have you Recently Had Thoughts About Hurting Someone [name]? No  Are You Planning To Harm Someone At This Time? No  Are you currently experiencing any auditory, visual or other hallucinations? Yes  Please explain the hallucinations you are currently experiencing: visual - seeing cousins that have passed away & auditory - can hear her aunts and cousins  Have You Used Any Alcohol or Drugs in the Past 24 Hours? No  Do you have any current medical co-morbidities that require immediate attention? No  Clinician description of patient physical appearance/behavior: calm, poor eye contact, cooperative  What Do You Feel Would Help You the Most Today? Social Support;Medication(s);Treatment for Depression or other mood problem  If access to [facility] was not available, would you have sought care in the Emergency Department? Yes  Determination of Need Routine (7 days)  Options For Referral Medication Management;Outpatient Therapy

## 2023-02-23 NOTE — ED Notes (Signed)
Pt given food and drink per PA [name]

## 2023-02-23 NOTE — ED Notes (Signed)
Pt's mother at bedside.

## 2023-02-23 NOTE — Discharge Instructions (Signed)
Your chest pain may be due to inflammation around your heart.  Take naproxen as prescribed and follow-up closely with cardiologist for outpatient evaluation.  He also have been diagnosed with a urinary tract infection.  Take antibiotic as prescribed.  Return to ER if you have any concern.  You may take over-the-counter melatonin p.o. as needed for difficulty sleeping.

## 2023-02-24 ENCOUNTER — Inpatient Hospital Stay (HOSPITAL_COMMUNITY)
Admission: AD | Admit: 2023-02-24 | Discharge: 2023-03-03 | DRG: 885 | Disposition: A | Discharge status: Home / Self Care | Payer: MEDICAID | Source: Intra-hospital | Attending: Psychiatry | Admitting: Psychiatry

## 2023-02-24 ENCOUNTER — Encounter (HOSPITAL_COMMUNITY): Payer: Self-pay

## 2023-02-24 ENCOUNTER — Encounter (HOSPITAL_COMMUNITY): Payer: Self-pay | Admitting: Behavioral Health

## 2023-02-24 DIAGNOSIS — N390 Urinary tract infection, site not specified: Secondary | ICD-10-CM | POA: Diagnosis present

## 2023-02-24 DIAGNOSIS — Z91013 Allergy to seafood: Secondary | ICD-10-CM | POA: Diagnosis not present

## 2023-02-24 DIAGNOSIS — Z8249 Family history of ischemic heart disease and other diseases of the circulatory system: Secondary | ICD-10-CM | POA: Diagnosis not present

## 2023-02-24 DIAGNOSIS — Z91411 Personal history of adult psychological abuse: Secondary | ICD-10-CM

## 2023-02-24 DIAGNOSIS — Z91410 Personal history of adult physical and sexual abuse: Secondary | ICD-10-CM

## 2023-02-24 DIAGNOSIS — G4700 Insomnia, unspecified: Secondary | ICD-10-CM | POA: Diagnosis present

## 2023-02-24 DIAGNOSIS — F411 Generalized anxiety disorder: Secondary | ICD-10-CM | POA: Diagnosis present

## 2023-02-24 DIAGNOSIS — F17200 Nicotine dependence, unspecified, uncomplicated: Secondary | ICD-10-CM | POA: Diagnosis present

## 2023-02-24 DIAGNOSIS — F17210 Nicotine dependence, cigarettes, uncomplicated: Secondary | ICD-10-CM | POA: Diagnosis present

## 2023-02-24 DIAGNOSIS — Z803 Family history of malignant neoplasm of breast: Secondary | ICD-10-CM

## 2023-02-24 DIAGNOSIS — Z79899 Other long term (current) drug therapy: Secondary | ICD-10-CM | POA: Diagnosis not present

## 2023-02-24 DIAGNOSIS — F29 Unspecified psychosis not due to a substance or known physiological condition: Secondary | ICD-10-CM | POA: Diagnosis present

## 2023-02-24 DIAGNOSIS — F3181 Bipolar II disorder: Principal | ICD-10-CM | POA: Diagnosis present

## 2023-02-24 DIAGNOSIS — Z560 Unemployment, unspecified: Secondary | ICD-10-CM | POA: Diagnosis not present

## 2023-02-24 DIAGNOSIS — Z818 Family history of other mental and behavioral disorders: Secondary | ICD-10-CM

## 2023-02-24 DIAGNOSIS — Z833 Family history of diabetes mellitus: Secondary | ICD-10-CM

## 2023-02-24 DIAGNOSIS — I309 Acute pericarditis, unspecified: Secondary | ICD-10-CM | POA: Diagnosis not present

## 2023-02-24 DIAGNOSIS — F1220 Cannabis dependence, uncomplicated: Secondary | ICD-10-CM | POA: Diagnosis present

## 2023-02-24 MED ORDER — DIPHENHYDRAMINE HCL 25 MG PO CAPS: 50.0000 mg | ORAL_CAPSULE | Freq: Three times a day (TID) | ORAL | Status: DC | PRN

## 2023-02-24 MED ORDER — ENSURE ENLIVE PO LIQD
237.0000 mL | Freq: Two times a day (BID) | ORAL | Status: DC
  Administered 2023-02-24 – 2023-03-02 (×12): 237 mL via ORAL
  Filled 2023-02-24 (×15): qty 237

## 2023-02-24 MED ORDER — CEPHALEXIN 500 MG PO CAPS
500.0000 mg | ORAL_CAPSULE | Freq: Two times a day (BID) | ORAL | Status: AC
  Administered 2023-02-24 – 2023-03-03 (×14): 500 mg via ORAL
  Filled 2023-02-24 (×19): qty 1

## 2023-02-24 MED ORDER — HALOPERIDOL 5 MG PO TABS: 5.0000 mg | ORAL_TABLET | Freq: Three times a day (TID) | ORAL | Status: DC | PRN

## 2023-02-24 MED ORDER — NITROFURANTOIN MONOHYD MACRO 100 MG PO CAPS
100.0000 mg | ORAL_CAPSULE | Freq: Two times a day (BID) | ORAL | Status: DC
  Filled 2023-02-24 (×2): qty 1

## 2023-02-24 MED ORDER — LORAZEPAM 1 MG PO TABS: 2.0000 mg | ORAL_TABLET | Freq: Three times a day (TID) | ORAL | Status: DC | PRN

## 2023-02-24 MED ORDER — ARIPIPRAZOLE 5 MG PO TABS
5.0000 mg | ORAL_TABLET | Freq: Every day | ORAL | Status: DC
  Administered 2023-02-25 – 2023-02-27 (×3): 5 mg via ORAL
  Filled 2023-02-24 (×4): qty 1

## 2023-02-24 MED ORDER — ARIPIPRAZOLE 2 MG PO TABS
2.0000 mg | ORAL_TABLET | Freq: Once | ORAL | Status: AC
  Administered 2023-02-24: 2 mg via ORAL
  Filled 2023-02-24 (×2): qty 1

## 2023-02-24 MED ORDER — SERTRALINE HCL 50 MG PO TABS
50.0000 mg | ORAL_TABLET | Freq: Every day | ORAL | Status: DC
  Administered 2023-02-25 – 2023-03-03 (×7): 50 mg via ORAL
  Filled 2023-02-24 (×9): qty 1

## 2023-02-24 MED ORDER — ACETAMINOPHEN 325 MG PO TABS
650.0000 mg | ORAL_TABLET | Freq: Four times a day (QID) | ORAL | Status: DC | PRN
  Administered 2023-02-24 – 2023-03-02 (×4): 650 mg via ORAL
  Filled 2023-02-24 (×4): qty 2

## 2023-02-24 MED ORDER — LORAZEPAM 2 MG/ML IJ SOLN: 2.0000 mg | Freq: Three times a day (TID) | INTRAMUSCULAR | Status: DC | PRN

## 2023-02-24 MED ORDER — HALOPERIDOL LACTATE 5 MG/ML IJ SOLN: 5.0000 mg | Freq: Three times a day (TID) | INTRAMUSCULAR | Status: DC | PRN

## 2023-02-24 MED ORDER — TRAZODONE HCL 50 MG PO TABS
50.0000 mg | ORAL_TABLET | Freq: Every evening | ORAL | Status: DC | PRN
  Administered 2023-02-24 – 2023-03-02 (×7): 50 mg via ORAL
  Filled 2023-02-24 (×7): qty 1

## 2023-02-24 MED ORDER — HYDROXYZINE HCL 25 MG PO TABS
25.0000 mg | ORAL_TABLET | Freq: Three times a day (TID) | ORAL | Status: DC | PRN
  Administered 2023-02-24 – 2023-03-02 (×7): 25 mg via ORAL
  Filled 2023-02-24 (×7): qty 1

## 2023-02-24 MED ORDER — DIPHENHYDRAMINE HCL 50 MG/ML IJ SOLN: 50.0000 mg | Freq: Three times a day (TID) | INTRAMUSCULAR | Status: DC | PRN

## 2023-02-24 MED ORDER — EPINEPHRINE 0.3 MG/0.3ML IJ SOAJ: 0.3000 mg | Freq: Every day | INTRAMUSCULAR | Status: DC | PRN

## 2023-02-24 MED ORDER — MAGNESIUM HYDROXIDE 400 MG/5ML PO SUSP
30.0000 mL | Freq: Every day | ORAL | Status: DC | PRN
  Administered 2023-02-25: 30 mL via ORAL
  Filled 2023-02-24: qty 30

## 2023-02-24 MED ORDER — NICOTINE 21 MG/24HR TD PT24
21.0000 mg | MEDICATED_PATCH | Freq: Every day | TRANSDERMAL | Status: DC
  Administered 2023-02-25: 21 mg via TRANSDERMAL
  Filled 2023-02-24 (×8): qty 1

## 2023-02-24 MED ORDER — NICOTINE POLACRILEX 2 MG MT GUM: 2.0000 mg | CHEWING_GUM | OROMUCOSAL | Status: DC | PRN

## 2023-02-24 MED ORDER — SERTRALINE HCL 25 MG PO TABS
25.0000 mg | ORAL_TABLET | Freq: Every day | ORAL | Status: AC
  Administered 2023-02-24: 25 mg via ORAL
  Filled 2023-02-24 (×2): qty 1

## 2023-02-24 MED ORDER — ALUM & MAG HYDROXIDE-SIMETH 200-200-20 MG/5ML PO SUSP: 30.0000 mL | ORAL | Status: DC | PRN

## 2023-02-24 NOTE — Progress Notes (Signed)
Admission Note:   22 yr female who presents [facility] in no acute distress for the treatment of psychosis. Pt appears sad and depressed. Pt was calm and cooperative with admission process. Pt denies SI and contracts for safety upon admission. Pt endorses AVH. Reports seeing deceased family members and hearing what her deceased family members say to her. Verbalizes having conversations with the deceased family members that she sees. Pt was noted to be responding during assessment and whispering to herself. Reports having difficulty sleeping and a decreased appetite.   Skin was assessed and found to be clear of any abnormal marks apart from tattoos located on her right middle finger, neck, and right ear. PT searched and no contraband found, POC and unit policies explained and understanding verbalized. Consents obtained. Food and fluids offered. Food and fluids accepted. Pt had no additional questions or concerns.

## 2023-02-24 NOTE — Group Note (Signed)
Recreation Therapy Group Note   Group Topic:Personal Development  Group Date: 02/24/2023 Start Time: 1055 End Time: 1115 Facilitators: Kyaira Trantham-McCall, LRT,CTRS Location: [address] Dayroom   Group Topic: Personal Development   Goal Area(s) Addresses:  Patient will successfully identify the benefits of gratitude. Patient will successfully identify things they are grateful for.     Intervention: Worksheet   Group Description: Patients were given a worksheet that was titled "Gratitude Log". The sheet was divided into four boxes. In the first box, patients were to identify five things they are grateful for. In the second box, patients had to identify three people that made their life happier. In the third box, patients were to identify challenges and what they learned from them. Lastly, patients were to identify their fondest memories.   Education: Geophysicist/field seismologist, Discharge Planning   Affect/Mood: N/A   Participation Level: Did not attend    Clinical Observations/Individualized Feedback:     Plan: Continue to engage patient in RT group sessions 2-3x/week.   Cinch Ormond-McCall, LRT,CTRS  02/24/2023 1:10 PM

## 2023-02-24 NOTE — Group Note (Signed)
Date:  02/24/2023 Time:  8:29 PM  Group Topic/Focus:  Wrap-Up Group:   The focus of this group is to help patients review their daily goal of treatment and discuss progress on daily workbooks.    Participation Level:  Active  Participation Quality:  Appropriate and Sharing  Affect:  Appropriate  Cognitive:  Appropriate  Insight: Appropriate  Engagement in Group:  Engaged  Modes of Intervention:  Discussion and Socialization  Additional Comments:  The patient stated she had an "okay" day. The patient stated that she was able to talk with social worker and others earlier today regarding her situation and stated that made her "feel good". The patient also stated that she talked with her grandmother earlier on the phone and that made her feel good as feel. The patient shared that she needs clothes and stated that her grandmother should be bring her some tomorrow or Sunday. The patient rated her day a 10/10.   Kennieth Francois 02/24/2023, 8:29 PM
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
## 2023-02-24 NOTE — [facility] IP Treatment Plan (Signed)
Interdisciplinary Treatment and Diagnostic Plan Update  02/24/2023 Time of Session: 10:45 AM  Courtney Ashley MRN: [id number]  Principal Diagnosis: Psychosis, unspecified psychosis type [facility])  Secondary Diagnoses: Principal Problem:   Psychosis, unspecified psychosis type (HCC)   Current Medications:  Current Facility-Administered Medications  Medication Dose Route Frequency Provider Last Rate Last Admin   acetaminophen (TYLENOL) tablet 650 mg  650 mg Oral Q6H PRN Onuoha, Chinwendu V, NP       alum & mag hydroxide-simeth (MAALOX/MYLANTA) 200-200-20 MG/5ML suspension 30 mL  30 mL Oral Q4H PRN Onuoha, Chinwendu V, NP       diphenhydrAMINE (BENADRYL) capsule 50 mg  50 mg Oral TID PRN Onuoha, Chinwendu V, NP       Or   diphenhydrAMINE (BENADRYL) injection 50 mg  50 mg Intramuscular TID PRN Onuoha, Chinwendu V, NP       haloperidol (HALDOL) tablet 5 mg  5 mg Oral TID PRN Onuoha, Chinwendu V, NP       Or   haloperidol lactate (HALDOL) injection 5 mg  5 mg Intramuscular TID PRN Onuoha, Chinwendu V, NP       hydrOXYzine (ATARAX) tablet 25 mg  25 mg Oral TID PRN Onuoha, Chinwendu V, NP       LORazepam (ATIVAN) tablet 2 mg  2 mg Oral TID PRN Onuoha, Chinwendu V, NP       Or   LORazepam (ATIVAN) injection 2 mg  2 mg Intramuscular TID PRN Onuoha, Chinwendu V, NP       magnesium hydroxide (MILK OF MAGNESIA) suspension 30 mL  30 mL Oral Daily PRN Onuoha, Chinwendu V, NP       traZODone (DESYREL) tablet 50 mg  50 mg Oral QHS PRN Onuoha, Chinwendu V, NP       PTA Medications: No medications prior to admission.    Patient Stressors: Medication change or noncompliance   Other: "herself"    Patient Strengths: Average or above average intelligence  Communication skills  General fund of knowledge  Motivation for treatment/growth  Supportive family/friends   Treatment Modalities: Medication Management, Group therapy, Case management,  1 to 1 session with clinician, Psychoeducation, Recreational  therapy.   Physician Treatment Plan for Primary Diagnosis: Psychosis, unspecified psychosis type (HCC) Long Term Goal(s):     Short Term Goals:    Medication Management: Evaluate patient's response, side effects, and tolerance of medication regimen.  Therapeutic Interventions: 1 to 1 sessions, Unit Group sessions and Medication administration.  Evaluation of Outcomes: Not Progressing  Physician Treatment Plan for Secondary Diagnosis: Principal Problem:   Psychosis, unspecified psychosis type (HCC)  Long Term Goal(s):     Short Term Goals:       Medication Management: Evaluate patient's response, side effects, and tolerance of medication regimen.  Therapeutic Interventions: 1 to 1 sessions, Unit Group sessions and Medication administration.  Evaluation of Outcomes: Not Progressing   RN Treatment Plan for Primary Diagnosis: Psychosis, unspecified psychosis type (HCC) Long Term Goal(s): Knowledge of disease and therapeutic regimen to maintain health will improve  Short Term Goals: Ability to remain free from injury will improve, Ability to verbalize frustration and anger appropriately will improve, Ability to demonstrate self-control, Ability to participate in decision making will improve, Ability to verbalize feelings will improve, Ability to disclose and discuss suicidal ideas, Ability to identify and develop effective coping behaviors will improve, and Compliance with prescribed medications will improve  Medication Management: RN will administer medications as ordered by provider, will assess and evaluate patient's  response and provide education to patient for prescribed medication. RN will report any adverse and/or side effects to prescribing provider.  Therapeutic Interventions: 1 on 1 counseling sessions, Psychoeducation, Medication administration, Evaluate responses to treatment, Monitor vital signs and CBGs as ordered, Perform/monitor CIWA, COWS, AIMS and Fall Risk screenings  as ordered, Perform wound care treatments as ordered.  Evaluation of Outcomes: Not Progressing   LCSW Treatment Plan for Primary Diagnosis: Psychosis, unspecified psychosis type (HCC) Long Term Goal(s): Safe transition to appropriate next level of care at discharge, Engage patient in therapeutic group addressing interpersonal concerns.  Short Term Goals: Engage patient in aftercare planning with referrals and resources, Increase social support, Increase ability to appropriately verbalize feelings, Increase emotional regulation, Facilitate acceptance of mental health diagnosis and concerns, Facilitate patient progression through stages of change regarding substance use diagnoses and concerns, Identify triggers associated with mental health/substance abuse issues, and Increase skills for wellness and recovery  Therapeutic Interventions: Assess for all discharge needs, 1 to 1 time with Social worker, Explore available resources and support systems, Assess for adequacy in community support network, Educate family and significant other(s) on suicide prevention, Complete Psychosocial Assessment, Interpersonal group therapy.  Evaluation of Outcomes: Not Progressing   Progress in Treatment: Attending groups: No. Participating in groups: No. Taking medication as prescribed: No. Toleration medication: No. Family/Significant other contact made: No, will contact:  Will contact whoever pt gives CSW permission to contact  Patient understands diagnosis: No. Discussing patient identified problems/goals with staff: Yes. Medical problems stabilized or resolved: Yes. Denies suicidal/homicidal ideation: Yes. Issues/concerns per patient self-inventory: No   New problem(s) identified: No, Describe:  None reported   New Short Term/Long Term Goal(s): medication stabilization, elimination of SI thoughts, development of comprehensive mental wellness plan.    Patient Goals:  " to change my mood "   Discharge  Plan or Barriers: Patient recently admitted. CSW will continue to follow and assess for appropriate referrals and possible discharge planning.    Reason for Continuation of Hospitalization: Depression Hallucinations Medication stabilization  Estimated Length of Stay:5-7 days   Last 3 [place] Suicide Severity Risk Score: Flowsheet Row [facility] (Current[facility] Most recent reading at 02/24/2023  1:30 AM ED from 02/23/2023 in [facility] Most recent reading at 02/23/2023  5:46 PM ED from 02/23/2023 in [facility] Most recent reading at 02/23/2023  9:31 AM  C-SSRS RISK CATEGORY No Risk No Risk No Risk       Last PHQ 2/9 Scores:    08/12/2019    3:18 PM 02/22/2019   10:11 AM 11/21/2018    9:31 AM  Depression screen PHQ 2/9  Decreased Interest 3 0 0  Down, Depressed, Hopeless 1 0 0  PHQ - 2 Score 4 0 0  Altered sleeping 1    Tired, decreased energy 1    Change in appetite 3    Feeling bad or failure about yourself  1    Trouble concentrating 1    Moving slowly or fidgety/restless 1    Suicidal thoughts 0    PHQ-9 Score 12      Scribe for Treatment Team: Beather Arbour 02/24/2023 1:03 PM

## 2023-02-24 NOTE — [facility] Suicide Risk Assessment (Signed)
[facility] INPATIENT:  Family/Significant Other Suicide Prevention Education  Suicide Prevention Education:  Education Completed; Courtney Ashley [phone] Database administrator, has been identified by the patient as the family member/significant other with whom the patient will be residing, and identified as the person(Courtney) who will aid the patient in the event of a mental health crisis (suicidal ideations/suicide attempt).  With written consent from the patient, the family member/significant other has been provided the following suicide prevention education, prior to the and/or following the discharge of the patient.  The suicide prevention education provided includes the following: Suicide risk factors Suicide prevention and interventions National Suicide Hotline telephone number [facility] assessment telephone number [facility] Emergency Assistance 911 [facility] and/or Residential Mobile Crisis Unit telephone number  Request made of family/significant other to: Remove weapons (e.g., guns, rifles, knives), all items previously/currently identified as safety concern.   Remove drugs/medications (over-the-counter, prescriptions, illicit drugs), all items previously/currently identified as a safety concern.  Courtney Ashley [phone] (Grandmother verbalizes understanding of the suicide prevention education information provided.  The family member/significant other agrees to remove the items of safety concern listed above.  Courtney Ashley Courtney Ashley 02/24/2023, 3:37 PM

## 2023-02-24 NOTE — H&P (Cosign Needed Addendum)
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 person, place, and time.  Psychiatric:        Thought Content: Thought content normal.    Review of Systems  Constitutional: Negative.   HENT: Negative.    Eyes: Negative.   Respiratory: Negative.    Cardiovascular: Negative.   Gastrointestinal:  Negative for heartburn.  Genitourinary: Negative.  Negative for dysuria.  Musculoskeletal: Negative.   Skin: Negative.   Neurological: Negative.   Psychiatric/Behavioral:  Positive for depression, hallucinations and substance abuse. Negative for memory loss and suicidal ideas. The patient is nervous/anxious and has insomnia.    Blood pressure 104/78, pulse 85, temperature 98.1 F (36.7 C), temperature source Oral, resp.  rate 16, height 5\' 5"  (1.651 m), weight 56.2 kg, SpO2 100%. Body mass index is 20.63 kg/m.  Treatment Plan Summary: Daily contact with patient to assess and evaluate symptoms and progress in treatment and Medication management  Safety and Monitoring: Voluntary admission to inpatient psychiatric unit for safety, stabilization and treatment Daily contact with patient to assess and evaluate symptoms and progress in treatment Patient's case to be discussed in multi-disciplinary team meeting Observation Level : q15 minute checks Vital signs: q12 hours Precautions: Safety  Long Term Goal(s): Improvement in symptoms so as ready for discharge  Short Term Goals: Ability to identify changes in lifestyle to reduce recurrence of condition will improve, Ability to verbalize feelings will improve, Ability to disclose and discuss suicidal ideas, Ability to demonstrate self-control will improve, Ability to identify and develop effective coping behaviors will improve, Ability to maintain clinical measurements within normal limits will improve, and Compliance with prescribed medications will improve  Diagnoses Principal Problem:   Severe bipolar II disorder, 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, mood-congruent (HCC) Active Problems:   Insomnia   GAD (generalized anxiety disorder)   Delta-9-tetrahydrocannabinol (THC) dependence (HCC)   Tobacco dependence  Medications: -Start Abilify 2 mg today, followed by 5 mg on 9/21 for psychosis -Start Zoloft 25 mg today, followed by 50 mg tomorrow 9/21 for depressive symptoms -Start Keflex 500 mg twice daily x 7 days for UTI -Continue hydroxyzine 25 mg 3 times daily as needed for anxiety -Continue trazodone 50 mg nightly as needed for sleep -Continue  agitation protocol: Haldol 5/Ativan 2/Benadryl 50 as needed 3 times daily for agitation  -Start EpiPen as needed for anaphylaxis related to fish allergy -Start nicotine patch 21 mg daily for  nicotine dependence -Start Nicorette gum as needed every 2 hours for nicotine dependence  Labs reviewed: orders placed for TSH, hemoglobin A1c, lipid panel, vitamin D.  EKG with QTc 410  Other PRNS -Continue Tylenol 650 mg every 6 hours PRN for mild pain -Continue Maalox 30 mg every 4 hrs PRN for indigestion -Continue Milk of Magnesia as needed every 6 hrs for constipation  Discharge Planning: Social work and case management to assist with discharge planning and identification of hospital follow-up needs prior to discharge Estimated LOS: 5-7 days Discharge Concerns: Need to establish a safety plan; Medication compliance and effectiveness Discharge Goals: Return home with outpatient referrals for mental health follow-up including medication management/psychotherapy  I certify that inpatient services furnished can reasonably be expected to improve the patient's condition.    Starleen Blue, NP 9/20/20245:44 PM   Starleen Blue, NP 9/20/20245:44 PM

## 2023-02-24 NOTE — Plan of Care (Signed)
Problem: Coping: Goal: Ability to demonstrate self-control will improve Outcome: Progressing   Problem: Health Behavior/Discharge Planning: Goal: Compliance with treatment plan for underlying cause of condition will improve Outcome: Progressing   Problem: Coping: Goal: Will verbalize feelings Outcome: Progressing  Pt reports she slept well with fair appetite. Denies SI, HI and AVH when assessed "not right this moment. Last time I heard any voices was yesterday. I'm ok right now". However, she's noted with fair eye contact, slow but steady gait, concrete, guarded, isolative, minimal but assertive on interactions. Received heat packs and PRN Tylenol for menstrual cramps at 1359 5/10, reports relief when reassessed at 1450. Encouraged to attend scheduled groups, visible in milieu at brief intervals. Rates her anxiety and depression both 5/10 but will not elaborate on stressors when prompted by Clinical research associate. Compliant with scheduled medications. Tolerated meals and fluids well. Safety checks maintained at Q 15 minutes intervals without incident. Support, encouragement and reassurance offered.

## 2023-02-24 NOTE — Progress Notes (Signed)
   02/24/23 0558  15 Minute Checks  Location Bedroom  Visual Appearance Calm  Behavior Sleeping  Sleep (Behavioral Health Patients Only)  Calculate sleep? (Click Yes once per 24 hr at 0600 safety check) Yes  Documented sleep last 24 hours 3.25
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
## 2023-02-24 NOTE — BHH Suicide Risk Assessment (Signed)
B[facility] INPATIENT:  Family/Significant Other Suicide Prevention Education  Suicide Prevention Education:  Education Completed; [phone]  Courtney Ashley [facility]) has been identified by the patient as the family member/significant other with whom the patient will be residing, and identified as the person(s) who will aid the patient in the event of a mental health crisis (suicidal ideations/suicide attempt).  With written consent from the patient, [phone]  Courtney Ashley  has been provided the following suicide prevention education, prior to the and/or following the discharge of the patient.  The suicide prevention education provided includes the following: Suicide risk factors Suicide prevention and interventions National Suicide Hotline telephone number [facility] assessment telephone number [facility] Emergency Assistance 911 [facility] and/or Residential Mobile Crisis Unit telephone number  Request made of family/significant other to: Remove weapons (e.g., guns, rifles, knives), all items previously/currently identified as safety concern.   Remove drugs/medications (over-the-counter, prescriptions, illicit drugs), all items previously/currently identified as a safety concern.  [name] verbalizes understanding of the suicide prevention education information provided.  The family member/significant other agrees to remove the items of safety concern listed above.  Courtney Ashley 02/24/2023, 3:18 PM

## 2023-02-24 NOTE — Tx Team (Signed)
Initial Treatment Plan 02/24/2023 Dimple Williquette NWG:[id number]    PATIENT STRESSORS: Medication change or noncompliance   Other: "herself"     PATIENT STRENGTHS: Average or above average intelligence  Communication skills  General fund of knowledge  Motivation for treatment/growth  Supportive family/friends    PATIENT IDENTIFIED PROBLEMS: Psychosis  Depression  Anxiety   "I want to work on my mental"               DISCHARGE CRITERIA:  Improved stabilization in mood, thinking, and/or behavior Motivation to continue treatment in a less acute level of care Need for constant or close observation no longer present Verbal commitment to aftercare and medication compliance  PRELIMINARY DISCHARGE PLAN: Outpatient therapy Return to previous living arrangement  PATIENT/FAMILY INVOLVEMENT: This treatment plan has been presented to and reviewed with the patient, Jeronica Hires. The patient has been given the opportunity to ask questions and make suggestions.  Daneil Dan, RN 02/24/2023,

## 2023-02-24 NOTE — Progress Notes (Signed)
   02/24/23 2225  Psych Admission Type (Psych Patients Only)  Admission Status Voluntary  Psychosocial Assessment  Patient Complaints Anxiety;Sadness;Worrying  Eye Contact Brief  Facial Expression Flat  Affect Anxious;Preoccupied;Sad  Speech Soft  Interaction Guarded;Isolative;Minimal  Motor Activity Fidgety  Appearance/Hygiene Unremarkable  Behavior Characteristics Cooperative  Mood Depressed;Anxious;Preoccupied;Pleasant  Aggressive Behavior  Effect No apparent injury  Thought Process  Coherency WDL  Content Preoccupation;Paranoia;Delusions  Delusions Paranoid  Perception Hallucinations  Hallucination Auditory;Visual  Judgment Impaired  Confusion None  Danger to Self  Current suicidal ideation? Denies  Agreement Not to Harm Self Yes  Description of Agreement verbal  Danger to Others  Danger to Others None reported or observed

## 2023-02-24 NOTE — ED Notes (Signed)
Patient transferred to [facility] via safe transport. Report had been Called to [employer].

## 2023-02-24 NOTE — Progress Notes (Signed)
Per [facility] TODAY 02/24/2023;Bed Assignment 503-1     Pt meets inpatient criteria per Loreen Freud   Attending Physician will be Dr. Phineas Inches, MD    Report can be called to: -Adult unit: [phone]   Pt can arrive after: BED IS READY NOW  Care Team notified: [facility] [place], Chinwendu Kandice Robinsons, MSW, [facility] 02/24/2023 12:48 AM

## 2023-02-24 NOTE — Plan of Care (Signed)
Problem: Education: Goal: Knowledge of [facility] Education information/materials will improve Outcome: Progressing

## 2023-02-25 DIAGNOSIS — F3181 Bipolar II disorder: Secondary | ICD-10-CM | POA: Diagnosis not present

## 2023-02-25 LAB — HEMOGLOBIN A1C
Hgb A1c MFr Bld: 5.4 % (ref 4.8–5.6)
Mean Plasma Glucose: 108.28 mg/dL

## 2023-02-25 LAB — LIPID PANEL
Cholesterol: 150 mg/dL (ref 0–200)
HDL: 50 mg/dL (ref >40)
LDL Cholesterol: 83 mg/dL (ref 0–99)
Total CHOL/HDL Ratio: 3 RATIO
Triglycerides: 84 mg/dL (ref <150)
VLDL: 17 mg/dL (ref 0–40)

## 2023-02-25 LAB — TSH: TSH: 0.333 u[IU]/mL — ABNORMAL LOW (ref 0.350–4.500)

## 2023-02-25 LAB — VITAMIN D 25 HYDROXY (VIT D DEFICIENCY, FRACTURES): Vit D, 25-Hydroxy: 26.5 ng/mL — ABNORMAL LOW (ref 30–100)

## 2023-02-25 MED ORDER — VITAMIN D3 25 MCG (1000 UNIT) PO TABS
1000.0000 [IU] | ORAL_TABLET | Freq: Every day | ORAL | Status: DC
  Administered 2023-02-25 – 2023-03-02 (×6): 1000 [IU] via ORAL
  Filled 2023-02-25 (×9): qty 1

## 2023-02-25 NOTE — Progress Notes (Signed)
Adult Psychoeducational Group Note  Date:  02/25/2023 Time:  8:22 PM  Group Topic/Focus:  Wrap-Up Group:   The focus of this group is to help patients review their daily goal of treatment and discuss progress on daily workbooks.  Participation Level:  Active  Participation Quality:  Appropriate  Affect:  Appropriate  Cognitive:  Appropriate  Insight: Appropriate  Engagement in Group:  Engaged  Modes of Intervention:  Discussion  Additional Comments:  Pt stated her day was okay.  Pt goal for the day was to stay out the room.  Pt met goal.  Courtney Ashley 02/25/2023, 8:22 PM

## 2023-02-25 NOTE — Plan of Care (Signed)
Problem: Activity: Goal: Interest or engagement in activities will improve Outcome: Progressing   Problem: Coping: Goal: Ability to verbalize frustrations and anger appropriately will improve Outcome: Progressing   Problem: Safety: Goal: Periods of time without injury will increase Outcome: Progressing   Pt awake, alert, oriented to self, place, situation and time. Observed in bed on initial contact; guarded with flat affect, fair eye contact, logical soft speech and brightens but on interactions. Denies SI, HI, VH and pain when assessed. Reports she slept well with good appetite.  Pt pt "The voices have faded out, I keep telling myself they're not real. The voices were really loud when I first came in.  Just talking to you guys here and the medicines have helped a lot. Y'all are train to keep [place] calm and safe" when asked about AH. Rates her anxiety 5/10 and depression 6/10 "I just want to go home to see my baby". Took her scheduled medications without issues.  Tolerated meals and fluids well. Safety checks maintained at Q 15 minutes intervals without outburst.

## 2023-02-25 NOTE — Plan of Care (Signed)
  Problem: Education: Goal: Knowledge of [facility] Education information/materials will improve Outcome: Progressing Goal: Emotional status will improve Outcome: Progressing Goal: Mental status will improve Outcome: Progressing Goal: Verbalization of understanding the information provided will improve Outcome: Progressing   

## 2023-02-25 NOTE — Progress Notes (Signed)
   02/25/23 2100  Psych Admission Type (Psych Patients Only)  Admission Status Voluntary  Psychosocial Assessment  Patient Complaints Anxiety  Eye Contact Fair  Facial Expression Animated  Affect Appropriate to circumstance  Speech Logical/coherent  Interaction Assertive  Motor Activity Fidgety  Appearance/Hygiene Unremarkable  Behavior Characteristics Cooperative;Appropriate to situation  Mood Pleasant  Thought Process  Coherency WDL  Content Preoccupation  Delusions Paranoid  Perception Hallucinations  Hallucination Auditory;Visual  Judgment Impaired  Confusion None  Danger to Self  Current suicidal ideation? Denies  Agreement Not to Harm Self Yes  Description of Agreement verbal  Danger to Others  Danger to Others None reported or observed

## 2023-02-25 NOTE — Progress Notes (Signed)
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  She was worked up medically and cleared prior to being discharged, but was taken to the [facility] health urgent care by her aunt on the same day with complaints of auditory and visual hallucinations of a deceased cousin & aunt.  Patient was eventually transferred voluntarily to this hospital for treatment and stabilization of her mental status.   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  She is  alert, oriented to person, place, time, and situation.  Chart reviewed and findings shared with the treatment team and discussed with attending psychiatrist.  Speech is soft, coherent with normal volume.  Reports her mood is improving and added that she has not heard any voices in 2 days.  Able to focus and answer assessment questions appropriately.  Denies delusional thinking or paranoia. Reports that anxiety is at manageable level and not overwhelming.  Observe attending therapeutic milieu and group unit activities.  Attention to hygiene is fair, however patient wearing 2 hospital gowns 1 at the front and 1 at the back. Nursing staff report patient sleeping about 7 hours last night and being restful.   Appetite is good Concentration is improving Energy level is adequate Denies suicidal thoughts and denies suicidal intent or plan.  Denies having any HI.  Denies having psychotic symptoms.   Denies having side effects to current psychiatric medications.   We discussed changes to current medication regimen, including increasing Abilify from 2 mg p.o. to 5 mg p.o. daily.  We will monitor therapeutic effectiveness.  Patient denies any side effects to her psychotropic medications.   Total Time spent with patient: 45 minutes  Past Psychiatric History: Previous Psych Diagnoses: Postpartum depression as per patient, and GAD Prior inpatient treatment: Denies Current/prior outpatient treatment: Reports a history of going to [facility] in [place], but does not always follow up with treatment in the community. Prior rehab hx: Denies Psychotherapy hx: In the past, but not helpful, wants trauma focused care prior to discharge. History of suicide attempts: Denies History of homicide or aggression: Intense anger outbursts which are recurrent Psychiatric medication history: Prozac in  the past as stated to [facility] counselor, unable to recall medications with Clinical research associate Psychiatric medication compliance history:  Noncompliant historically Neuromodulation history: None Current Psychiatrist: None Current therapist: None   Past Medical History:  Past Medical History:  Diagnosis Date   ADHD (attention deficit hyperactivity disorder)    childhood   Anemia    Nexplanon in place 2018    Past Surgical History:  Procedure Laterality Date   TONSILLECTOMY AND ADENOIDECTOMY     Family History:  Family History  Problem Relation Age of Onset   Diabetes Maternal Grandmother    Family Psychiatric  History: See H&P  Social History:  Social History   Substance and Sexual Activity  Alcohol Use Not Currently     Social History   Substance and Sexual Activity  Drug Use Yes   Types: Marijuana    Social History   Socioeconomic History   Marital status: Single    Spouse name: Not on file   Number of children: Not on file   Years of education: Not on file   Highest education level: Not on file  Occupational History   Not on file  Tobacco Use   Smoking status: Some Days    Types: Cigarettes   Smokeless tobacco: Never  Vaping Use   Vaping status: Never Used  Substance and Sexual Activity   Alcohol use: Not Currently   Drug use: Yes    Types: Marijuana   Sexual activity: Yes    Birth control/protection: Implant  Other Topics Concern   Not on file  Social History Narrative   Not on file   Social Determinants of Health   Financial Resource Strain: Not on file  Food Insecurity: No Food Insecurity (02/24/2023)   Hunger Vital Sign    Worried About Running Out of Food in the Last Year: Never true    Ran Out of Food in the Last Year: Never true  Transportation Needs: No Transportation Needs (02/24/2023)   PRAPARE - Administrator, Civil Service (Medical): No    Lack of Transportation (Non-Medical): No  Physical Activity: Not on file  Stress: Not on file  Social Connections: Not on file   Additional Social History:    Sleep: Good  Appetite:  Good  Current  Medications: Current Facility-Administered Medications  Medication Dose Route Frequency Provider Last Rate Last Admin   acetaminophen (TYLENOL) tablet 650 mg  650 mg Oral Q6H PRN Onuoha, Chinwendu V, NP   650 mg at 02/24/23 1359   alum & mag hydroxide-simeth (MAALOX/MYLANTA) 200-200-20 MG/5ML suspension 30 mL  30 mL Oral Q4H PRN Onuoha, Chinwendu V, NP       ARIPiprazole (ABILIFY) tablet 5 mg  5 mg Oral Daily Nkwenti, Doris, NP   5 mg at 02/25/23 0756   cephALEXin (KEFLEX) capsule 500 mg  500 mg Oral Q12H Nkwenti, Doris, NP   500 mg at 02/25/23 0757   diphenhydrAMINE (BENADRYL) capsule 50 mg  50 mg Oral TID PRN Onuoha, Chinwendu V, NP       Or   diphenhydrAMINE (BENADRYL) injection 50 mg  50 mg Intramuscular TID PRN Onuoha, Chinwendu V, NP       EPINEPHrine (EPI-PEN) injection 0.3 mg  0.3 mg Intramuscular Daily PRN Nkwenti, Doris, NP       feeding supplement (ENSURE ENLIVE / ENSURE PLUS) liquid 237 mL  237 mL Oral BID BM Attiah, Nadir, MD   237 mL at 02/25/23 1654   haloperidol (HALDOL) tablet 5 mg  5 mg Oral TID PRN Onuoha, Chinwendu V, NP       Or   haloperidol lactate (HALDOL) injection 5 mg  5 mg Intramuscular TID PRN Onuoha, Chinwendu V, NP       hydrOXYzine (ATARAX) tablet 25 mg  25 mg Oral TID PRN Onuoha, Chinwendu V, NP   25 mg at 02/24/23 2110   LORazepam (ATIVAN) tablet 2 mg  2 mg Oral TID PRN Onuoha, Chinwendu V, NP       Or   LORazepam (ATIVAN) injection 2 mg  2 mg Intramuscular TID PRN Onuoha, Chinwendu V, NP       magnesium hydroxide (MILK OF MAGNESIA) suspension 30 mL  30 mL Oral Daily PRN Onuoha, Chinwendu V, NP   30 mL at 02/25/23 1019   nicotine (NICODERM CQ - dosed in mg/24 hours) patch 21 mg  21 mg Transdermal Daily Starleen Blue, NP   21 mg at 02/25/23 0756   nicotine polacrilex (NICORETTE) gum 2 mg  2 mg Oral PRN Starleen Blue, NP       sertraline (ZOLOFT) tablet 50 mg  50 mg Oral Daily Starleen Blue, NP   50 mg at 02/25/23 0754   traZODone (DESYREL) tablet 50 mg  50  mg Oral QHS PRN Onuoha, Chinwendu V, NP   50 mg at 02/24/23 2109   Lab Results:  Results for orders placed or performed during the hospital encounter of 02/24/23 (from the past 48 hour(s))  Lipid panel     Status: None   Collection Time: 02/25/23  6:26 AM  Result Value Ref Range   Cholesterol 150 0 - 200 mg/dL   Triglycerides 84 <387 mg/dL   HDL 50 >56 mg/dL   Total CHOL/HDL Ratio 3.0 RATIO   VLDL 17 0 - 40 mg/dL   LDL Cholesterol 83 0 - 99 mg/dL    Comment:        Total Cholesterol/HDL:CHD Risk Coronary Heart Disease Risk Table                     Men   Women  1/2 Average Risk   3.4   3.3  Average Risk       5.0   4.4  2 X Average Risk   9.6   7.1  3 X Average Risk  23.4   11.0        Use the calculated Patient Ratio above and the CHD Risk Table to determine the patient's CHD Risk.        ATP III CLASSIFICATION (LDL):  <100     mg/dL   Optimal  433-295  mg/dL   Near or Above                    Optimal  130-159  mg/dL   Borderline  188-416  mg/dL   High  >606     mg/dL   Very High Performed at [facility], [address], [place], [place] [address]   VITAMIN D 25 Hydroxy (Vit-D Deficiency, Fractures)     Status: Abnormal   Collection Time: 02/25/23  6:26 AM  Result Value Ref Range   Vit D, 25-Hydroxy 26.50 (L) 30 - 100 ng/mL    Comment: (NOTE) Vitamin D deficiency has been defined by the Institute of Medicine  and an Endocrine Society practice guideline as a level of serum 25-OH  vitamin D less than 20 ng/mL (1,2). The Endocrine Society went on to  further  define vitamin D insufficiency as a level between 21 and 29  ng/mL (2).  1. IOM (Institute of Medicine). 2010. Dietary reference intakes for  calcium and D. [place]: [employer]. 2. Holick MF, [place], Bischoff-Ferrari HA, et al. Evaluation,  treatment, and prevention of vitamin D deficiency: an Endocrine  Society clinical practice guideline, JCEM. 2011 Jul; 96(7):  1911-30.  Performed at [facility], [address] [place], [place] [address]   Hemoglobin A1c     Status: None   Collection Time: 02/25/23  6:26 AM  Result Value Ref Range   Hgb A1c MFr Bld 5.4 4.8 - 5.6 %    Comment: (NOTE) Pre diabetes:          5.7%-6.4%  Diabetes:              >6.4%  Glycemic control for   <7.0% adults with diabetes    Mean Plasma Glucose 108.28 mg/dL    Comment: Performed at [facility], [address], [place], [place] [address]  TSH     Status: Abnormal   Collection Time: 02/25/23  6:26 AM  Result Value Ref Range   TSH 0.333 (L) 0.350 - 4.500 uIU/mL    Comment: Performed by a 3rd Generation assay with a functional sensitivity of <=0.01 uIU/mL. Performed at [facility], [address], [place], [place] [address]    Blood Alcohol level:  Lab Results  Component Value Date   ETH <10 02/23/2023   ETH 221 (H) 09/18/2022   Metabolic Disorder Labs: Lab Results  Component Value Date   HGBA1C 5.4 02/25/2023   MPG 108.28 02/25/2023   No results found for: "PROLACTIN" Lab Results  Component Value Date   CHOL 150 02/25/2023   TRIG 84 02/25/2023   HDL 50 02/25/2023   CHOLHDL 3.0 02/25/2023   VLDL 17 02/25/2023   LDLCALC 83 02/25/2023   Physical Findings: AIMS: Facial and Oral Movements Muscles of Facial Expression: None, normal Lips and Perioral Area: None, normal Jaw: None, normal Tongue: None, normal,Extremity Movements Upper (arms, wrists, hands, fingers): None, normal Lower (legs, knees, ankles, toes): None, normal, Trunk Movements Neck, shoulders, hips: None, normal, Overall Severity Severity of abnormal movements (highest score from questions above): None, normal Incapacitation due to abnormal movements: None, normal Patient's awareness of abnormal movements (rate only patient's report): No Awareness, Dental Status Current problems with teeth and/or dentures?: No Does patient usually wear dentures?: No   CIWA:    COWS:     Musculoskeletal: Strength & Muscle Tone: within normal limits Gait & Station: normal Patient leans: N/A  Psychiatric Specialty Exam:  Presentation  General Appearance:  Fairly Groomed; Casual  Eye Contact: Fair  Speech: Clear and Coherent  Speech Volume: Normal  Handedness: Right  Mood and Affect  Mood: Anxious  Affect: Congruent  Thought Process  Thought Processes: Coherent; Linear  Descriptions of Associations:Intact  Orientation:Full (Time, Place and Person)  Thought Content:Logical  History of Schizophrenia/Schizoaffective disorder:No  Duration of Psychotic Symptoms:Less than six months  Hallucinations:Hallucinations: None  Ideas of Reference:None  Suicidal Thoughts:Suicidal Thoughts: No  Homicidal Thoughts:Homicidal Thoughts: No  Sensorium  Memory: Immediate Good; Recent Good  Judgment: Poor  Insight: Poor  Executive Functions  Concentration: Fair  Attention Span: Fair  Recall: [employer] of Knowledge: Fair  Language: Fair  Psychomotor Activity  Psychomotor Activity: Psychomotor Activity: Normal  Assets  Assets: Housing; Manufacturing systems engineer; Desire for Improvement; Physical Health; Resilience  Sleep  Sleep: Sleep: Good Number of Hours of Sleep: 7  Physical Exam: Physical Exam Vitals and nursing note reviewed.  HENT:     Head: Normocephalic.     Nose: Nose normal.     Mouth/Throat:     Mouth: Mucous membranes are moist.  Eyes:     Extraocular Movements: Extraocular movements intact.  Cardiovascular:     Rate and Rhythm: Normal rate.     Pulses: Normal pulses.  Pulmonary:     Effort: Pulmonary effort is normal.  Abdominal:     Comments: Deferred  Genitourinary:    Comments: Deferred Musculoskeletal:        General: Normal range of motion.     Cervical back: Normal range of motion.  Skin:    General: Skin is warm.  Neurological:     Mental Status: She is alert and oriented to  person, place, and time.  Psychiatric:        Mood and Affect: Mood normal.        Behavior: Behavior normal.    Review of Systems  Constitutional:  Negative for chills and fever.  HENT:  Negative for sore throat.   Eyes:  Negative for blurred vision.  Respiratory:  Negative for cough, shortness of breath and wheezing.   Cardiovascular:  Negative for chest pain and palpitations.  Gastrointestinal:  Negative for abdominal pain, heartburn, nausea and vomiting.  Genitourinary:  Negative for dysuria, frequency and urgency.  Musculoskeletal: Negative.   Skin:  Negative for rash.  Neurological:  Negative for dizziness, tingling, tremors, sensory change and headaches.  Endo/Heme/Allergies:        See allergy listing  Psychiatric/Behavioral:  The patient is nervous/anxious and has insomnia.    Blood pressure 122/81, pulse 97, temperature 98.5 F (36.9 C), temperature source Oral, resp. rate 16, height 5\' 5"  (1.651 m), weight 56.2 kg, SpO2 100%. Body mass index is 20.63 kg/m.  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, mood-congruent (HCC) Active Problems:   Insomnia   GAD (generalized anxiety disorder)   Delta-9-tetrahydrocannabinol (THC) dependence (HCC)   Tobacco dependence   Medications: -Continue Abilify 5 mg on 9/21 for psychosis -Continue Zoloft 25 mg today, followed by 50 mg tomorrow 9/21 for depressive symptoms -Continue Keflex 500 mg twice daily x 7 days for UTI -Continue hydroxyzine 25 mg 3 times daily as needed for anxiety -Continue trazodone 50 mg nightly as needed for sleep -Continue agitation protocol: Haldol 5/Ativan 2/Benadryl 50 as needed 3 times daily for agitation  -Continue EpiPen as needed for 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   EKG with QTc 410   Other PRNS -Continue Tylenol 650 mg every 6 hours PRN for mild pain -Continue Maalox 30 mg every 4 hrs PRN for indigestion -Continue Milk of Magnesia as needed every 6 hrs for constipation   Discharge Planning: Social work and case management to assist with discharge planning and identification of hospital follow-up needs prior to discharge Estimated LOS: 5-7 days Discharge Concerns: Need to establish a safety plan; Medication compliance and  effectiveness Discharge Goals: Return home with outpatient referrals for mental health follow-up including medication management/psychotherapy   I certify that inpatient services furnished can reasonably be expected to improve the patient's condition.      Cecilie Lowers, FNP 02/25/2023, 5:47 PM

## 2023-02-26 DIAGNOSIS — F3181 Bipolar II disorder: Secondary | ICD-10-CM | POA: Diagnosis not present

## 2023-02-26 NOTE — Progress Notes (Signed)
[facility] MD Progress Note  02/26/2023 3:31 PM Courtney Ashley  MRN:  [id number]  Subjective:  Courtney Ashley states, " I need my medications to get well and to function.  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  She was worked up medically and cleared prior to being discharged, but was taken to the [facility] health urgent care by her aunt on the same day with complaints of auditory and visual hallucinations of a deceased cousin & aunt.  Patient was eventually transferred voluntarily to this hospital for treatment and stabilization of her mental status.   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  However, her mood was down last night because she did not receive any visitation from her family members.  Today, she is alert, oriented to person, place, time, and situation.  She presents pleasant with smiles and reports she is okay.  Report being anxious in the morning, however, nursing staff administered anxiety medication and she is fine. Chart reviewed and findings shared with the treatment team and discussed with attending psychiatrist.  Speech is soft, coherent with normal volume.  Reports her mood is improving and added that she has not heard any voices in 3 days.  Further added that while attempting to swallow her medication in the morning, half of the medication fell into her drinking water.  The nursing staff assumed she was cheeking the medication, however she told the nurse that she needed her medications to get well and function well.  Able to focus and answer assessment questions appropriately.  Denies delusional thinking or paranoia. Reports that anxiety is at manageable level.  Observe attending therapeutic milieu and group unit activities.  Attention to hygiene is commendable and wearing street clothes today. Nursing staff report patient sleeping about 12.75 hours last night and being restful.   Appetite is good Concentration is improving Energy level is adequate Denies suicidal thoughts and denies suicidal intent or plan.  Denies having any HI.  Denies having psychotic symptoms.   Denies having side effects to current psychiatric medications.   We discussed compliance to current medication regimen.  We will monitor therapeutic effectiveness of medications.  Patient denies any side effects to her psychotropic medications.   Total Time spent  with patient: 35 minutes  Past Psychiatric History: Previous Psych Diagnoses: Postpartum depression as per patient, and GAD Prior inpatient treatment:  Denies Current/prior outpatient treatment: Reports a history of going to [facility] in [place], but does not always follow up with treatment in the community. Prior rehab hx: Denies Psychotherapy hx: In the past, but not helpful, wants trauma focused care prior to discharge. History of suicide attempts: Denies History of homicide or aggression: Intense anger outbursts which are recurrent Psychiatric medication history: Prozac in the past as stated to [facility] counselor, unable to recall medications with Clinical research associate Psychiatric medication compliance history: Noncompliant historically Neuromodulation history: None Current Psychiatrist: None Current therapist: None   Past Medical History:  Past Medical History:  Diagnosis Date   ADHD (attention deficit hyperactivity disorder)    childhood   Anemia    Nexplanon in place 2018    Past Surgical History:  Procedure Laterality Date   TONSILLECTOMY AND ADENOIDECTOMY     Family History:  Family History  Problem Relation Age of Onset   Diabetes Maternal Grandmother    Family Psychiatric  History: See H&P  Social History:  Social History   Substance and Sexual Activity  Alcohol Use Not Currently     Social History   Substance and Sexual Activity  Drug Use Yes   Types: Marijuana    Social History   Socioeconomic History   Marital status: Single    Spouse name: Not on file   Number of children: Not on file   Years of education: Not on file   Highest education level: Not on file  Occupational History   Not on file  Tobacco Use   Smoking status: Some Days    Types: Cigarettes   Smokeless tobacco: Never  Vaping Use   Vaping status: Never Used  Substance and Sexual Activity   Alcohol use: Not Currently   Drug use: Yes    Types: Marijuana   Sexual activity: Yes    Birth control/protection: Implant  Other Topics Concern   Not on file  Social History Narrative   Not on file   Social Determinants of Health   Financial Resource  Strain: Not on file  Food Insecurity: No Food Insecurity (02/24/2023)   Hunger Vital Sign    Worried About Running Out of Food in the Last Year: Never true    Ran Out of Food in the Last Year: Never true  Transportation Needs: No Transportation Needs (02/24/2023)   PRAPARE - Administrator, Civil Service (Medical): No    Lack of Transportation (Non-Medical): No  Physical Activity: Not on file  Stress: Not on file  Social Connections: Not on file   Additional Social History:    Sleep: Good  Appetite:  Good  Current Medications: Current Facility-Administered Medications  Medication Dose Route Frequency Provider Last Rate Last Admin   acetaminophen (TYLENOL) tablet 650 mg  650 mg Oral Q6H PRN Onuoha, Chinwendu V, NP   650 mg at 02/24/23 1359   alum & mag hydroxide-simeth (MAALOX/MYLANTA) 200-200-20 MG/5ML suspension 30 mL  30 mL Oral Q4H PRN Onuoha, Chinwendu V, NP       ARIPiprazole (ABILIFY) tablet 5 mg  5 mg Oral Daily Nkwenti, Doris, NP   5 mg at 02/26/23 0748   cephALEXin (KEFLEX) capsule 500 mg  500 mg Oral Q12H Nkwenti, Doris, NP   500 mg at 02/26/23 0750   cholecalciferol (VITAMIN D3) tablet 1,000 Units  1,000 Units Oral QHS Harley Fitzwater C,  FNP   1,000 Units at 02/25/23 2208   diphenhydrAMINE (BENADRYL) capsule 50 mg  50 mg Oral TID PRN Onuoha, Chinwendu V, NP       Or   diphenhydrAMINE (BENADRYL) injection 50 mg  50 mg Intramuscular TID PRN Onuoha, Chinwendu V, NP       EPINEPHrine (EPI-PEN) injection 0.3 mg  0.3 mg Intramuscular Daily PRN Nkwenti, Doris, NP       feeding supplement (ENSURE ENLIVE / ENSURE PLUS) liquid 237 mL  237 mL Oral BID BM Attiah, Nadir, MD   237 mL at 02/26/23 1457   haloperidol (HALDOL) tablet 5 mg  5 mg Oral TID PRN Onuoha, Chinwendu V, NP       Or   haloperidol lactate (HALDOL) injection 5 mg  5 mg Intramuscular TID PRN Onuoha, Chinwendu V, NP       hydrOXYzine (ATARAX) tablet 25 mg  25 mg Oral TID PRN Onuoha, Chinwendu V, NP   25 mg at  02/25/23 2042   LORazepam (ATIVAN) tablet 2 mg  2 mg Oral TID PRN Onuoha, Chinwendu V, NP       Or   LORazepam (ATIVAN) injection 2 mg  2 mg Intramuscular TID PRN Onuoha, Chinwendu V, NP       magnesium hydroxide (MILK OF MAGNESIA) suspension 30 mL  30 mL Oral Daily PRN Onuoha, Chinwendu V, NP   30 mL at 02/25/23 1019   nicotine (NICODERM CQ - dosed in mg/24 hours) patch 21 mg  21 mg Transdermal Daily Starleen Blue, NP   21 mg at 02/25/23 0756   nicotine polacrilex (NICORETTE) gum 2 mg  2 mg Oral PRN Starleen Blue, NP       sertraline (ZOLOFT) tablet 50 mg  50 mg Oral Daily Starleen Blue, NP   50 mg at 02/26/23 0748   traZODone (DESYREL) tablet 50 mg  50 mg Oral QHS PRN Onuoha, Chinwendu V, NP   50 mg at 02/25/23 2042   Lab Results:  Results for orders placed or performed during the hospital encounter of 02/24/23 (from the past 48 hour(s))  Lipid panel     Status: None   Collection Time: 02/25/23  6:26 AM  Result Value Ref Range   Cholesterol 150 0 - 200 mg/dL   Triglycerides 84 <962 mg/dL   HDL 50 >95 mg/dL   Total CHOL/HDL Ratio 3.0 RATIO   VLDL 17 0 - 40 mg/dL   LDL Cholesterol 83 0 - 99 mg/dL    Comment:        Total Cholesterol/HDL:CHD Risk Coronary Heart Disease Risk Table                     Men   Women  1/2 Average Risk   3.4   3.3  Average Risk       5.0   4.4  2 X Average Risk   9.6   7.1  3 X Average Risk  23.4   11.0        Use the calculated Patient Ratio above and the CHD Risk Table to determine the patient's CHD Risk.        ATP III CLASSIFICATION (LDL):  <100     mg/dL   Optimal  284-132  mg/dL   Near or Above                    Optimal  130-159  mg/dL   Borderline  440-102  mg/dL  High  >190     mg/dL   Very High Performed at [facility], [address] [place], [place] [address]   VITAMIN D 25 Hydroxy (Vit-D Deficiency, Fractures)     Status: Abnormal   Collection Time: 02/25/23  6:26 AM  Result Value Ref Range   Vit D,  25-Hydroxy 26.50 (L) 30 - 100 ng/mL    Comment: (NOTE) Vitamin D deficiency has been defined by the Institute of Medicine  and an Endocrine Society practice guideline as a level of serum 25-OH  vitamin D less than 20 ng/mL (1,2). The Endocrine Society went on to  further define vitamin D insufficiency as a level between 21 and 29  ng/mL (2).  1. IOM (Institute of Medicine). 2010. Dietary reference intakes for  calcium and D. [place]: [employer]. 2. Holick MF, Binkley Clay, Bischoff-Ferrari HA, et al. Evaluation,  treatment, and prevention of vitamin D deficiency: an Endocrine  Society clinical practice guideline, JCEM. 2011 Jul; 96(7): 1911-30.  Performed at [facility], [address], [place], [place] [address]   Hemoglobin A1c     Status: None   Collection Time: 02/25/23  6:26 AM  Result Value Ref Range   Hgb A1c MFr Bld 5.4 4.8 - 5.6 %    Comment: (NOTE) Pre diabetes:          5.7%-6.4%  Diabetes:              >6.4%  Glycemic control for   <7.0% adults with diabetes    Mean Plasma Glucose 108.28 mg/dL    Comment: Performed at [facility], [address] [place], [place] [address]  TSH     Status: Abnormal   Collection Time: 02/25/23  6:26 AM  Result Value Ref Range   TSH 0.333 (L) 0.350 - 4.500 uIU/mL    Comment: Performed by a 3rd Generation assay with a functional sensitivity of <=0.01 uIU/mL. Performed at [facility], [address] [place], [place] [address]    Blood Alcohol level:  Lab Results  Component Value Date   ETH <10 02/23/2023   ETH 221 (H) 09/18/2022   Metabolic Disorder Labs: Lab Results  Component Value Date   HGBA1C 5.4 02/25/2023   MPG 108.28 02/25/2023   No results found for: "PROLACTIN" Lab Results  Component Value Date   CHOL 150 02/25/2023   TRIG 84 02/25/2023   HDL 50 02/25/2023   CHOLHDL 3.0 02/25/2023   VLDL 17 02/25/2023   LDLCALC 83 02/25/2023   Physical  Findings: AIMS: Facial and Oral Movements Muscles of Facial Expression: None, normal Lips and Perioral Area: None, normal Jaw: None, normal Tongue: None, normal,Extremity Movements Upper (arms, wrists, hands, fingers): None, normal Lower (legs, knees, ankles, toes): None, normal, Trunk Movements Neck, shoulders, hips: None, normal, Overall Severity Severity of abnormal movements (highest score from questions above): None, normal Incapacitation due to abnormal movements: None, normal Patient's awareness of abnormal movements (rate only patient's report): No Awareness, Dental Status Current problems with teeth and/or dentures?: No Does patient usually wear dentures?: No  CIWA:    COWS:     Musculoskeletal: Strength & Muscle Tone: within normal limits Gait & Station: normal Patient leans: N/A  Psychiatric Specialty Exam:  Presentation  General Appearance:  Casual; Appropriate for Environment; Fairly Groomed  Eye Contact: Good  Speech: Clear and Coherent  Speech Volume: Normal  Handedness: Right  Mood and Affect  Mood: Euthymic  Affect: Congruent  Thought Process  Thought Processes: Coherent; Goal Directed  Descriptions of Associations:Intact  Orientation:Full (Time, Place and Person)  Thought Content:Logical  History of Schizophrenia/Schizoaffective disorder:No  Duration of Psychotic Symptoms:Less than six months  Hallucinations:Hallucinations: None  Ideas of Reference:None  Suicidal Thoughts:Suicidal Thoughts: No  Homicidal Thoughts:Homicidal Thoughts: No  Sensorium  Memory: Immediate Good; Recent Good  Judgment: Fair  Insight: Fair  Art therapist  Concentration: Fair  Attention Span: Fair  Recall: Fair  Fund of Knowledge: Fair  Language: Good  Psychomotor Activity  Psychomotor Activity: Psychomotor Activity: Normal  Assets  Assets: Manufacturing systems engineer; Housing; Desire for Improvement; Physical Health;  Resilience  Sleep  Sleep: Sleep: Good Number of Hours of Sleep: 12.5  Physical Exam: Physical Exam Vitals and nursing note reviewed.  HENT:     Head: Normocephalic.     Nose: Nose normal.     Mouth/Throat:     Mouth: Mucous membranes are moist.  Eyes:     Extraocular Movements: Extraocular movements intact.  Cardiovascular:     Rate and Rhythm: Normal rate.     Pulses: Normal pulses.  Pulmonary:     Effort: Pulmonary effort is normal.  Abdominal:     Comments: Deferred  Genitourinary:    Comments: Deferred Musculoskeletal:        General: Normal range of motion.     Cervical back: Normal range of motion.  Skin:    General: Skin is warm.  Neurological:     Mental Status: She is alert and oriented to person, place, and time.  Psychiatric:        Mood and Affect: Mood normal.        Behavior: Behavior normal.    Review of Systems  Constitutional:  Negative for chills and fever.  HENT:  Negative for sore throat.   Eyes:  Negative for blurred vision.  Respiratory:  Negative for cough, shortness of breath and wheezing.   Cardiovascular:  Negative for chest pain and palpitations.  Gastrointestinal:  Negative for abdominal pain, heartburn, nausea and vomiting.  Genitourinary:  Negative for dysuria, frequency and urgency.  Musculoskeletal: Negative.   Skin:  Negative for rash.  Neurological:  Negative for dizziness, tingling, tremors, sensory change and headaches.  Endo/Heme/Allergies:        See allergy listing  Psychiatric/Behavioral:  The patient is nervous/anxious and has insomnia.    Blood pressure 136/80, pulse 70, temperature 98.5 F (36.9 C), temperature source Oral, resp. rate 14, height 5\' 5"  (1.651 m), weight 56.2 kg, SpO2 100%. Body mass index is 20.63 kg/m.  Treatment Plan Summary: Daily contact with patient to assess and evaluate symptoms and progress in treatment and Medication management  Diagnoses Principal Problem:   Severe bipolar II disorder,  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, mood-congruent (HCC) Active Problems:   Insomnia   GAD (generalized anxiety disorder)   Delta-9-tetrahydrocannabinol (THC) dependence (HCC)   Tobacco dependence   Medications: -Continue Abilify 5 mg on 9/21 for psychosis -Continue Zoloft 50 mg tomorrow for depressive symptoms -Continue Keflex 500 mg twice daily x 7 days for UTI -Continue hydroxyzine 25 mg 3 times daily as needed for anxiety -Continue trazodone 50 mg nightly as needed for sleep -Continue agitation protocol: Haldol 5/Ativan 2/Benadryl 50 as needed 3 times daily for agitation  -Continue EpiPen as needed for 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   EKG with QTc 410   Other PRNS -Continue Tylenol 650 mg every 6 hours PRN for mild pain -Continue Maalox 30 mg every 4 hrs PRN for indigestion -Continue Milk of Magnesia as needed every 6 hrs for constipation   Discharge Planning: Social work and case management to assist with discharge planning and identification of hospital follow-up needs prior to discharge Estimated LOS: 5-7 days Discharge Concerns: Need to establish a safety plan; Medication compliance and effectiveness Discharge Goals: Return home with outpatient referrals for mental health follow-up including medication management/psychotherapy   I certify that inpatient services furnished can reasonably be expected to improve the patient's condition.      Cecilie Lowers, FNP 02/26/2023, 3:31 PM Patient ID: Courtney Ashley, female   DOB: [birth year], 22 y.o.   MRN: [id number]

## 2023-02-26 NOTE — [facility] Group Notes (Signed)
Pt did not attend wrap-up group

## 2023-02-26 NOTE — Plan of Care (Signed)

## 2023-02-26 NOTE — Progress Notes (Signed)
Patient ID: Courtney Ashley, female   DOB: 03-29-2001, 22 y.o.   MRN: [id number] Patient presents with anxious mood, affect congruent. Pt states '' I'm fine. I'm doing fine. '' Pt reports eating and sleeping well. She denies any SI HI or AV Hallucinations. She completed her self inventory form and rates her depression, hopelessness and anxiety at 0/10 on scale 10 being worst 0 being none. Pt did appear suspicious this am during med pass and questioned why writer completed mouth check. Pt educated and complied with am medications. Pt is visible in the milieu, no inappropriate behaviors noted. Pt is safe, will con't to monitor.

## 2023-02-26 NOTE — [facility] Group Notes (Addendum)
[facility] Group Notes:  (Nursing/MHT/Case Management/Adjunct)  Date:  02/26/2023  Time:  2:28 PM  Type of Therapy:  Psychoeducational Skills  Participation Level:  Active  Participation Quality:  Appropriate  Affect:  approrpiate  Cognitive:  appropriate  Insight:  improved  Engagement in Group:  engaged  Modes of Intervention:  Discussion, Education, and Exploration  Summary of Progress/Problems:  Mental health wellness podcast played from [place], regarding tips for promoting mental well being and healthy relationships.Pt attended and shared appropriately.

## 2023-02-27 DIAGNOSIS — F3181 Bipolar II disorder: Secondary | ICD-10-CM | POA: Diagnosis not present

## 2023-02-27 MED ORDER — ARIPIPRAZOLE 10 MG PO TABS
10.0000 mg | ORAL_TABLET | Freq: Every day | ORAL | Status: DC
  Administered 2023-02-28 – 2023-03-03 (×4): 10 mg via ORAL
  Filled 2023-02-27 (×6): qty 1

## 2023-02-27 NOTE — Progress Notes (Signed)
Recreation Therapy Notes  INPATIENT RECREATION THERAPY ASSESSMENT  Patient Details Name: Lexys Krider MRN: [id number] DOB: 02-06-2001 Today's Date: 02/27/2023       Information Obtained From: Patient  Able to Participate in Assessment/Interview: Yes  Patient Presentation: Alert  Reason for Admission (Per Patient): Other (Comments) (AVH)  Patient Stressors: Other (Comment) ("my environment")  Coping Skills:   Journal, Music, Deep Breathing, Meditate, Prayer, Avoidance, Dance, Other (Comment) (write music, draw)  Leisure Interests (2+):  Individual - Other (Comment), Nature - Other (Comment), Art - Draw, Music - Singing, Music - Write music (Sleep; Be outside)  Frequency of Recreation/Participation: Other (Comment) (Daily)  Awareness of Community Resources:  Yes  Community Resources:  Library, [employer]  Current Use: Yes  If no, Barriers?:    Expressed Interest in [employer] Information: No  [employer] of Residence:  Engineer, technical sales  Patient Main Form of Transportation: Therapist, music  Patient Strengths:  Emotions; Boundaries  Patient Identified Areas of Improvement:  "the way I think, no negative thoughts"  Patient Goal for Hospitalization:  "find out who I am and what's wrong with me"  Current SI (including self-harm):  No  Current HI:  No  Current AVH: No  Staff Intervention Plan: Group Attendance, Collaborate with Interdisciplinary Treatment Team  Consent to Intern Participation: N/A   Mateus Rewerts-McCall, LRT,CTRS Chariah Bailey A Trayven Lumadue-McCall 02/27/2023, 1:46 PM

## 2023-02-27 NOTE — Progress Notes (Signed)
   02/27/23 0559  15 Minute Checks  Location Bedroom  Visual Appearance Calm  Behavior Composed  Sleep (Behavioral Health Patients Only)  Calculate sleep? (Click Yes once per 24 hr at 0600 safety check) Yes  Documented sleep last 24 hours 9.25

## 2023-02-27 NOTE — Progress Notes (Signed)
   02/27/23 1000  Psych Admission Type (Psych Patients Only)  Admission Status Voluntary  Psychosocial Assessment  Patient Complaints Worrying  Eye Contact Fair  Facial Expression Flat  Affect Anxious  Speech Logical/coherent  Interaction Minimal  Motor Activity Slow  Appearance/Hygiene Unremarkable  Behavior Characteristics Cooperative  Mood Preoccupied  Thought Process  Coherency WDL  Content Preoccupation  Delusions Paranoid  Perception WDL  Hallucination None reported or observed  Judgment Impaired  Confusion None  Danger to Self  Current suicidal ideation? Denies  Danger to Others  Danger to Others None reported or observed

## 2023-02-27 NOTE — Plan of Care (Signed)
?  Problem: Education: ?Goal: Knowledge of [facility] Education information/materials will improve ?Outcome: Not Progressing ?Goal: Emotional status will improve ?Outcome: Not Progressing ?Goal: Mental status will improve ?Outcome: Not Progressing ?Goal: Verbalization of understanding the information provided will improve ?Outcome: Not Progressing ?  ?Problem: Activity: ?Goal: Interest or engagement in activities will improve ?Outcome: Not Progressing ?Goal: Sleeping patterns will improve ?Outcome: Not Progressing ?  ?Problem: Coping: ?Goal: Ability to verbalize frustrations and anger appropriately will improve ?Outcome: Not Progressing ?Goal: Ability to demonstrate self-control will improve ?Outcome: Not Progressing ?  ?Problem: Health Behavior/Discharge Planning: ?Goal: Identification of resources available to assist in meeting health care needs will improve ?Outcome: Not Progressing ?Goal: Compliance with treatment plan for underlying cause of condition will improve ?Outcome: Not Progressing ?  ?Problem: Physical Regulation: ?Goal: Ability to maintain clinical measurements within normal limits will improve ?Outcome: Not Progressing ?  ?Problem: Safety: ?Goal: Periods of time without injury will increase ?Outcome: Not Progressing ?  ?Problem: Activity: ?Goal: Will verbalize the importance of balancing activity with adequate rest periods ?Outcome: Not Progressing ?  ?Problem: Education: ?Goal: Will be free of psychotic symptoms ?Outcome: Not Progressing ?Goal: Knowledge of the prescribed therapeutic regimen will improve ?Outcome: Not Progressing ?  ?Problem: Coping: ?Goal: Coping ability will improve ?Outcome: Not Progressing ?Goal: Will verbalize feelings ?Outcome: Not Progressing ?  ?Problem: Health Behavior/Discharge Planning: ?Goal: Compliance with prescribed medication regimen will improve ?Outcome: Not Progressing ?  ?Problem: Nutritional: ?Goal: Ability to achieve adequate nutritional intake will  improve ?Outcome: Not Progressing ?  ?Problem: Role Relationship: ?Goal: Ability to communicate needs accurately will improve ?Outcome: Not Progressing ?Goal: Ability to interact with others will improve ?Outcome: Not Progressing ?  ?Problem: Safety: ?Goal: Ability to redirect hostility and anger into socially appropriate behaviors will improve ?Outcome: Not Progressing ?Goal: Ability to remain free from injury will improve ?Outcome: Not Progressing ?  ?Problem: Self-Care: ?Goal: Ability to participate in self-care as condition permits will improve ?Outcome: Not Progressing ?  ?Problem: Self-Concept: ?Goal: Will verbalize positive feelings about self ?Outcome: Not Progressing ?  ?Problem: Education: ?Goal: Ability to state activities that reduce stress will improve ?Outcome: Not Progressing ?  ?Problem: Coping: ?Goal: Ability to identify and develop effective coping behavior will improve ?Outcome: Not Progressing ?  ?Problem: Self-Concept: ?Goal: Ability to identify factors that promote anxiety will improve ?Outcome: Not Progressing ?Goal: Level of anxiety will decrease ?Outcome: Not Progressing ?Goal: Ability to modify response to factors that promote anxiety will improve ?Outcome: Not Progressing ?  ?

## 2023-02-27 NOTE — [facility] Group Notes (Signed)
Spiritual care group on grief and loss facilitated by Chaplain Dyanne Carrel, Bcc and Arlyce Dice, Mdiv  Group Goal: Support / Education around grief and loss  Members engage in facilitated group support and psycho-social education.  Group Description:  Following introductions and group rules, group members engaged in facilitated group dialogue and support around topic of loss, with particular support around experiences of loss in their lives. Group Identified types of loss (relationships / self / things) and identified patterns, circumstances, and changes that precipitate losses. Reflected on thoughts / feelings around loss, normalized grief responses, and recognized variety in grief experience. Group encouraged individual reflection on safe space and on the coping skills that they are already utilizing.  Group drew on Adlerian / Rogerian and narrative framework  Patient Progress: Attended group and actively engaged and participated in group conversation and activities.

## 2023-02-27 NOTE — Progress Notes (Signed)
   02/27/23 0300  Psych Admission Type (Psych Patients Only)  Admission Status Voluntary  Psychosocial Assessment  Patient Complaints Anxiety  Eye Contact Fair  Facial Expression Animated  Affect Anxious  Speech Logical/coherent  Interaction Assertive  Motor Activity Fidgety  Appearance/Hygiene Unremarkable  Behavior Characteristics Cooperative  Mood Anxious  Thought Process  Coherency WDL  Content Preoccupation  Delusions Paranoid  Perception WDL  Hallucination None reported or observed  Judgment Impaired  Confusion None  Danger to Self  Current suicidal ideation? Denies  Agreement Not to Harm Self Yes  Description of Agreement verbal  Danger to Others  Danger to Others None reported or observed

## 2023-02-27 NOTE — [facility] Group Notes (Signed)
Adult Psychoeducational Group Note  Date:  02/27/2023 Time:  8:29 PM  Group Topic/Focus:  Wrap-Up Group:   The focus of this group is to help patients review their daily goal of treatment and discuss progress on daily workbooks.  Participation Level:  Active  Participation Quality:  Appropriate  Affect:  Appropriate  Cognitive:  Appropriate  Insight: Appropriate  Engagement in Group:  Engaged  Modes of Intervention:  Discussion  Additional Comments:   Pt states that she had a good day and is hopeful to be able to leave in the next few days. Pt states that she had a really positive interaction in group earlier in the day, and is hopeful that she utilizes some of that information in her day to day. Pt is going to live with her grandmother when she leaves. Pt denies everything  Vevelyn Pat 02/27/2023, 8:29 PM

## 2023-02-27 NOTE — Plan of Care (Signed)
Problem: Education: Goal: Emotional status will improve Outcome: Progressing Goal: Mental status will improve Outcome: Progressing   Problem: Activity: Goal: Interest or engagement in activities will improve Outcome: Progressing Goal: Sleeping patterns will improve Outcome: Progressing   Problem: Safety: Goal: Periods of time without injury will increase Outcome: Progressing

## 2023-02-27 NOTE — Plan of Care (Signed)
  Problem: Education: Goal: Emotional status will improve Outcome: Progressing   Problem: Activity: Goal: Sleeping patterns will improve Outcome: Progressing   Problem: Safety: Goal: Ability to remain free from injury will improve Outcome: Progressing

## 2023-02-27 NOTE — Progress Notes (Signed)
   02/27/23 2200  Psych Admission Type (Psych Patients Only)  Admission Status Voluntary  Psychosocial Assessment  Patient Complaints Worrying  Eye Contact Fair  Facial Expression Flat  Affect Anxious  Speech Logical/coherent  Interaction Assertive;Cautious  Motor Activity Slow  Appearance/Hygiene Unremarkable  Behavior Characteristics Cooperative  Mood Suspicious;Preoccupied  Aggressive Behavior  Effect No apparent injury  Thought Process  Coherency WDL  Content Preoccupation  Delusions Paranoid  Perception WDL  Hallucination None reported or observed  Judgment Impaired  Confusion None  Danger to Self  Current suicidal ideation? Denies

## 2023-02-27 NOTE — Group Note (Signed)
Recreation Therapy Group Note   Group Topic:Healthy Decision Making  Group Date: 02/27/2023 Start Time: 1010 End Time: 1045 Facilitators: [place] Fant-McCall, LRT,CTRS Location: [address] Day[address]   Group Topic: Decision Making, Problem Solving, Communication  Goal Area(s) Addresses:  Patient will effectively work with peer towards shared goal.  Patient will identify factors that guided their decision making.  Patient will pro-socially communicate ideas during group session.   Intervention: Survival Scenario - pencil, paper  Group Description: Patients were given a scenario that they were going to be stranded on a deserted [place] for several months before being rescued. Writer tasked them with making a list of 15 things they would choose to bring with them for "survival". The list of items was prioritized most important to least. Each patient would come up with their own list, then work together to create a new list of 15 items while in a group of 3-5 peers. LRT discussed each person's list and how it differed from others. The debrief included discussion of priorities, good decisions versus bad decisions, and how it is important to think before acting so we can make the best decision possible. LRT tied the concept of effective communication among group members to patient's support systems outside of the hospital and its benefit post discharge.  Education: Pharmacist, community, Priorities, Support System, Discharge Planning   Education Outcome: Acknowledges education/In group clarification/Needs additional education   Affect/Mood: Appropriate   Participation Level: Engaged   Participation Quality: Independent   Behavior: Appropriate   Speech/Thought Process: Focused   Insight: Good   Judgement: Good   Modes of Intervention: Writing   Patient Response to Interventions:  Engaged   Education Outcome:  In group clarification offered    Clinical Observations/Individualized  Feedback: Pt was bright and engaged throughout group session. Pt was able to identify some of the things on her list as food supple bundle, sleeping bag, machete, fire starter and map of key points. When pt grouped with her peers, she was attentive to their ideas and would discuss them with peers to make the final discission on what to add to their list.     Plan: Continue to engage patient in RT group sessions 2-3x/week.   Lorijean Husser-McCall, LRT,CTRS 02/27/2023 12:52 PM

## 2023-02-27 NOTE — Progress Notes (Signed)
[facility] MD Progress Note  02/27/2023 11:07 AM Courtney Ashley  MRN:  [id number]  Subjective:  Courtney Ashley states, " I need my medications to get well and to function.  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  She was worked up medically and cleared prior to being discharged, but was taken to the [facility] health urgent care by her aunt on the same day with complaints of auditory and visual hallucinations of a deceased cousin & aunt.  Patient was eventually transferred voluntarily to this hospital for treatment and stabilization of her mental status.   Yesterday the psychiatry team made the following recommendations:  - Increase Abilify from 5 mg to 10 mg p.o. 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 She is alert, oriented to person, place, time, and situation.  She presents pleasant with smiles and reports she is okay. Chart reviewed and findings shared with the treatment team and discussed with attending psychiatrist.  Speech is soft, coherent with normal volume.  Reports her mood is improving and added that she heard some soft whispers this morning.  Abilify 5 mg p.o. daily increased to 10 mg p.o. daily for psychosis.  Patient denies delusional thinking or paranoia.  Able to focus and answer assessment questions appropriately.  Patient reports that her family members brought her change of clothing, and she is so happy to receive them.  No acute discomfort observed.  Endorses having BM yesterday. . Reports that anxiety is at manageable level.  Observe attending therapeutic milieu and group unit activities.  Attention to hygiene is commendable and wearing street clothes today. Nursing staff report patient sleeping about 9.25 hours last night and being restful.   Appetite is good Concentration is improving Energy level is adequate Denies suicidal thoughts and denies suicidal intent or plan.  Denies having any HI.  Denies having psychotic symptoms, however of hearing some soft whispers this morning.  Denies having side effects to current psychiatric medications.   We discussed compliance to current medication regimen.  Abilify 5 mg increased to 10 mg p.o. daily for psychosis and we will monitor therapeutic effectiveness of medications.  Patient denies any side effects to her psychotropic medications.   Total Time spent with patient: 35 minutes  Past Psychiatric History: Previous Psych Diagnoses: Postpartum depression as per patient, and GAD Prior  inpatient treatment: Denies Current/prior outpatient treatment: Reports a history of going to [facility] in [place], but does not always follow up with treatment in  the community. Prior rehab hx: Denies Psychotherapy hx: In the past, but not helpful, wants trauma focused care prior to discharge. History of suicide attempts: Denies History of homicide or aggression: Intense anger outbursts which are recurrent Psychiatric medication history: Prozac in the past as stated to [facility] counselor, unable to recall medications with Clinical research associate Psychiatric medication compliance history: Noncompliant historically Neuromodulation history: None Current Psychiatrist: None Current therapist: None   Past Medical History:  Past Medical History:  Diagnosis Date   ADHD (attention deficit hyperactivity disorder)    childhood   Anemia    Nexplanon in place 2018    Past Surgical History:  Procedure Laterality Date   TONSILLECTOMY AND ADENOIDECTOMY     Family History:  Family History  Problem Relation Age of Onset   Diabetes Maternal Grandmother    Family Psychiatric  History: See H&P  Social History:  Social History   Substance and Sexual Activity  Alcohol Use Not Currently     Social History   Substance and Sexual Activity  Drug Use Yes   Types: Marijuana    Social History   Socioeconomic History   Marital status: Single    Spouse name: Not on file   Number of children: Not on file   Years of education: Not on file   Highest education level: Not on file  Occupational History   Not on file  Tobacco Use   Smoking status: Some Days    Types: Cigarettes   Smokeless tobacco: Never  Vaping Use   Vaping status: Never Used  Substance and Sexual Activity   Alcohol use: Not Currently   Drug use: Yes    Types: Marijuana   Sexual activity: Yes    Birth control/protection: Implant  Other Topics Concern   Not on file  Social History Narrative   Not on file   Social Determinants of Health   Financial Resource Strain: Not on file  Food Insecurity: No Food Insecurity (02/24/2023)   Hunger Vital Sign    Worried About Running Out of Food in the Last Year:  Never true    Ran Out of Food in the Last Year: Never true  Transportation Needs: No Transportation Needs (02/24/2023)   PRAPARE - Administrator, Civil Service (Medical): No    Lack of Transportation (Non-Medical): No  Physical Activity: Not on file  Stress: Not on file  Social Connections: Not on file   Additional Social History:    Sleep: Good  Appetite:  Good  Current Medications: Current Facility-Administered Medications  Medication Dose Route Frequency Provider Last Rate Last Admin   acetaminophen (TYLENOL) tablet 650 mg  650 mg Oral Q6H PRN Onuoha, Chinwendu V, NP   650 mg at 02/27/23 0847   alum & mag hydroxide-simeth (MAALOX/MYLANTA) 200-200-20 MG/5ML suspension 30 mL  30 mL Oral Q4H PRN Onuoha, Chinwendu V, NP       [START ON 02/28/2023] ARIPiprazole (ABILIFY) tablet 10 mg  10 mg Oral Daily Ignatius Kloos C, FNP       cephALEXin (KEFLEX) capsule 500 mg  500 mg Oral Q12H Nkwenti, Doris, NP   500 mg at 02/27/23 0846   cholecalciferol (VITAMIN D3) tablet 1,000 Units  1,000 Units Oral QHS Cecilie Lowers, FNP   1,000 Units at 02/26/23 2047   diphenhydrAMINE (BENADRYL) capsule 50 mg  50 mg  Oral TID PRN Onuoha, Chinwendu V, NP       Or   diphenhydrAMINE (BENADRYL) injection 50 mg  50 mg Intramuscular TID PRN Onuoha, Chinwendu V, NP       EPINEPHrine (EPI-PEN) injection 0.3 mg  0.3 mg Intramuscular Daily PRN Nkwenti, Doris, NP       feeding supplement (ENSURE ENLIVE / ENSURE PLUS) liquid 237 mL  237 mL Oral BID BM Attiah, Nadir, MD   237 mL at 02/27/23 1059   haloperidol (HALDOL) tablet 5 mg  5 mg Oral TID PRN Onuoha, Chinwendu V, NP       Or   haloperidol lactate (HALDOL) injection 5 mg  5 mg Intramuscular TID PRN Onuoha, Chinwendu V, NP       hydrOXYzine (ATARAX) tablet 25 mg  25 mg Oral TID PRN Onuoha, Chinwendu V, NP   25 mg at 02/26/23 2048   LORazepam (ATIVAN) tablet 2 mg  2 mg Oral TID PRN Onuoha, Chinwendu V, NP       Or   LORazepam (ATIVAN) injection 2 mg  2 mg  Intramuscular TID PRN Onuoha, Chinwendu V, NP       magnesium hydroxide (MILK OF MAGNESIA) suspension 30 mL  30 mL Oral Daily PRN Onuoha, Chinwendu V, NP   30 mL at 02/25/23 1019   nicotine (NICODERM CQ - dosed in mg/24 hours) patch 21 mg  21 mg Transdermal Daily Nkwenti, Doris, NP   21 mg at 02/25/23 0756   nicotine polacrilex (NICORETTE) gum 2 mg  2 mg Oral PRN Starleen Blue, NP       sertraline (ZOLOFT) tablet 50 mg  50 mg Oral Daily Nkwenti, Doris, NP   50 mg at 02/27/23 0845   traZODone (DESYREL) tablet 50 mg  50 mg Oral QHS PRN Onuoha, Chinwendu V, NP   50 mg at 02/26/23 2047   Lab Results:  No results found for this or any previous visit (from the past 48 hour(s)).  Blood Alcohol level:  Lab Results  Component Value Date   ETH <10 02/23/2023   ETH 221 (H) 09/18/2022   Metabolic Disorder Labs: Lab Results  Component Value Date   HGBA1C 5.4 02/25/2023   MPG 108.28 02/25/2023   No results found for: "PROLACTIN" Lab Results  Component Value Date   CHOL 150 02/25/2023   TRIG 84 02/25/2023   HDL 50 02/25/2023   CHOLHDL 3.0 02/25/2023   VLDL 17 02/25/2023   LDLCALC 83 02/25/2023   Physical Findings: AIMS: Facial and Oral Movements Muscles of Facial Expression: None, normal Lips and Perioral Area: None, normal Jaw: None, normal Tongue: None, normal,Extremity Movements Upper (arms, wrists, hands, fingers): None, normal Lower (legs, knees, ankles, toes): None, normal, Trunk Movements Neck, shoulders, hips: None, normal, Overall Severity Severity of abnormal movements (highest score from questions above): None, normal Incapacitation due to abnormal movements: None, normal Patient's awareness of abnormal movements (rate only patient's report): No Awareness, Dental Status Current problems with teeth and/or dentures?: No Does patient usually wear dentures?: No  CIWA:    COWS:     Musculoskeletal: Strength & Muscle Tone: within normal limits Gait & Station: normal Patient  leans: N/A  Psychiatric Specialty Exam:  Presentation  General Appearance:  Appropriate for Environment; Casual; Fairly Groomed  Eye Contact: Good  Speech: Clear and Coherent  Speech Volume: Normal  Handedness: Right  Mood and Affect  Mood: Euthymic  Affect: Congruent  Thought Process  Thought Processes: Coherent; Goal Directed  Descriptions of  Associations:Intact  Orientation:Full (Time, Place and Person)  Thought Content:Logical  History of Schizophrenia/Schizoaffective disorder:No  Duration of Psychotic Symptoms:Less than six months  Hallucinations:Hallucinations: Auditory Description of Auditory Hallucinations: Report hearing whispers morning.  Abilify increased from 5 mg to 10 mg.  Ideas of Reference:None  Suicidal Thoughts:Suicidal Thoughts: No  Homicidal Thoughts:Homicidal Thoughts: No  Sensorium  Memory: Immediate Good; Recent Good  Judgment: Fair  Insight: Fair  Art therapist  Concentration: Good  Attention Span: Good  Recall: Fair  Fund of Knowledge: Fair  Language: Good  Psychomotor Activity  Psychomotor Activity: Psychomotor Activity: Normal  Assets  Assets: Communication Skills; Desire for Improvement; Housing; Physical Health; Resilience; Social Support  Sleep  Sleep: Sleep: Good Number of Hours of Sleep: 9.5  Physical Exam: Physical Exam Vitals and nursing note reviewed.  HENT:     Head: Normocephalic.     Nose: Nose normal.     Mouth/Throat:     Mouth: Mucous membranes are moist.  Eyes:     Extraocular Movements: Extraocular movements intact.  Cardiovascular:     Rate and Rhythm: Normal rate.     Pulses: Normal pulses.  Pulmonary:     Effort: Pulmonary effort is normal.  Abdominal:     Comments: Deferred  Genitourinary:    Comments: Deferred Musculoskeletal:        General: Normal range of motion.     Cervical back: Normal range of motion.  Skin:    General: Skin is warm.   Neurological:     Mental Status: She is alert and oriented to person, place, and time.  Psychiatric:        Mood and Affect: Mood normal.        Behavior: Behavior normal.    Review of Systems  Constitutional:  Negative for chills and fever.  HENT:  Negative for sore throat.   Eyes:  Negative for blurred vision.  Respiratory:  Negative for cough, shortness of breath and wheezing.   Cardiovascular:  Negative for chest pain and palpitations.  Gastrointestinal:  Negative for abdominal pain, heartburn, nausea and vomiting.  Genitourinary:  Negative for dysuria, frequency and urgency.  Musculoskeletal: Negative.   Skin:  Negative for rash.  Neurological:  Negative for dizziness, tingling, tremors, sensory change and headaches.  Endo/Heme/Allergies:        See allergy listing  Psychiatric/Behavioral:  The patient is nervous/anxious.    Blood pressure 134/76, pulse 73, temperature 98.8 F (37.1 C), temperature source Oral, resp. rate 16, height 5\' 5"  (1.651 m), weight 56.2 kg, SpO2 100%. Body mass index is 20.63 kg/m.  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   EKG with QTc 410   Other PRNS -Continue Tylenol 650 mg every 6 hours PRN for mild pain -Continue Maalox 30 mg every 4 hrs PRN for indigestion -Continue Milk of Magnesia as needed every 6 hrs for constipation   Discharge Planning: Social work and case management to assist with discharge planning and identification of hospital follow-up needs prior to discharge Estimated LOS: 5-7 days Discharge Concerns: Need to establish a safety plan; Medication compliance and effectiveness Discharge Goals: Return home with outpatient referrals for mental health follow-up including medication management/psychotherapy   I certify that inpatient services furnished can reasonably be expected to improve the patient's condition.      Cecilie Lowers, FNP 02/27/2023, 11:07 AM Patient ID: Marylynn Pearson, female   DOB: [birth year], 22 y.o.   MRN: [id number] Patient ID: Elanah Preston, female   DOB: 10/05/2000, 22 y.o.   MRN: [id number]

## 2023-02-27 NOTE — B[facility] Group Notes (Signed)
Adult Psychoeducational Group Note  Date:  02/27/2023 Time:  10:10 AM  Group Topic/Focus:  Goals Group:   The focus of this group is to help patients establish daily goals to achieve during treatment and discuss how the patient can incorporate goal setting into their daily lives to aide in recovery. Orientation:   The focus of this group is to educate the patient on the purpose and policies of crisis stabilization and provide a format to answer questions about their admission.  The group details unit policies and expectations of patients while admitted.  Participation Level:  Active  Participation Quality:  Appropriate  Affect:  Appropriate  Cognitive:  Appropriate  Insight: Appropriate  Engagement in Group:  Engaged  Modes of Intervention:  Discussion  Additional Comments:  Pt attended the goals group and remained appropriate and engaged throughout the duration of the group.   Fara Olden O 02/27/2023, 10:10 AM

## 2023-02-27 NOTE — Group Note (Signed)
LCSW Group Therapy Note   Group Date: 02/27/2023 Start Time: 1300 End Time: 1400   Type of Therapy and Topic:  Group Therapy: Challenging Core Beliefs  Participation Level:  Active  Description of Group:  Patients were educated about core beliefs and asked to identify one harmful core belief that they have. Patients were asked to explore from where those beliefs originate. Patients were asked to discuss how those beliefs make them feel and the resulting behaviors of those beliefs. They were then be asked if those beliefs are true and, if so, what evidence they have to support them. Lastly, group members were challenged to replace those negative core beliefs with helpful beliefs.   Therapeutic Goals:   1. Patient will identify harmful core beliefs and explore the origins of such beliefs. 2. Patient will identify feelings and behaviors that result from those core beliefs. 3. Patient will discuss whether such beliefs are true. 4.  Patient will replace harmful core beliefs with helpful ones.  Summary of Patient Progress:  Patient actively engaged in processing and exploring how core beliefs are formed and how they impact thoughts, feelings, and behaviors. Patient proved open to input from peers and feedback from CSW. Patient demonstrated positive and negative insight into the subject matter, was respectful and supportive of peers, and participated throughout the entire session.  Therapeutic Modalities: Cognitive Behavioral Therapy; Solution-Focused Therapy   Beather Arbour 02/27/2023  3:31 PM

## 2023-02-28 DIAGNOSIS — F3181 Bipolar II disorder: Secondary | ICD-10-CM | POA: Diagnosis not present

## 2023-02-28 NOTE — Group Note (Signed)
Recreation Therapy Group Note   Group Topic:Health and Wellness  Group Date: 02/28/2023 Start Time: 1040 End Time: 1110 Facilitators: Sissi Padia-McCall, LRT,CTRS Location: [address] Day[address]   Group Topic: Wellness  Goal Area(s) Addresses:  Patient will define components of whole wellness. Patient will verbalize benefit of whole wellness.  Group Description: Exercise. LRT explained to patients the importance of being physically active. LRT stressed that being active doesn't mean do anything strenuous but simply getting your body moving. Patients took turns leading the group in the exercises of their choosing. Patients were to try and exercise for at least 30 minutes. Patients were instructed to take breaks and get water as needed.   Education: Wellness, Building control surveyor.   Education Outcome: Acknowledges education/In group clarification offered/Needs additional education.    Affect/Mood: Appropriate   Participation Level: Engaged   Participation Quality: Independent   Behavior: Appropriate   Speech/Thought Process: Focused   Insight: Good   Judgement: Good   Modes of Intervention: Music   Patient Response to Interventions:  Engaged   Education Outcome:  In group clarification offered    Clinical Observations/Individualized Feedback: Pt was bright and engaged throughout. Pt was social and engaged with peers. Pt led group in wall sit and leg raises.      Plan: Continue to engage patient in RT group sessions 2-3x/week.   Nik Gorrell-McCall, LRT,CTRS 02/28/2023 11:48 AM

## 2023-02-28 NOTE — Group Note (Signed)
Recreation Therapy Group Note   Group Topic:Animal Assisted Therapy   Group Date: 02/28/2023 Start Time: 0945 End Time: 1030 Facilitators: Javen Ridings-McCall, LRT,CTRS Location: [address] Dayroom   Animal-Assisted Activity (AAA) Program Checklist/Progress Notes Patient Eligibility Criteria Checklist & Daily Group note for Rec Tx Intervention  AAA/T Program Assumption of Risk Form signed by Patient/ or Parent Legal Guardian Yes  Patient is free of allergies or severe asthma Yes  Patient reports no fear of animals Yes  Patient reports no history of cruelty to animals Yes  Patient understands his/her participation is voluntary Yes  Patient washes hands before animal contact Yes  Patient washes hands after animal contact Yes   Affect/Mood: Appropriate   Participation Level: Engaged   Participation Quality: Independent   Behavior: Appropriate   Speech/Thought Process: Focused   Insight: Good   Judgement: Good   Modes of Intervention: Teaching laboratory technician   Patient Response to Interventions:  Engaged   Education Outcome:  In group clarification offered    Clinical Observations/Individualized Feedback: Patient attended session and interacted appropriately with therapy dog and peers. Patient asked appropriate questions about therapy dog and his training. Patient shared stories about their pets at home with group.    Plan: Continue to engage patient in RT group sessions 2-3x/week.   Courtney Ashley, NT,  02/28/2023 12:31 PM

## 2023-02-28 NOTE — Plan of Care (Signed)
°  Problem: Education: °Goal: Emotional status will improve °Outcome: Progressing °Goal: Mental status will improve °Outcome: Progressing °  °Problem: Activity: °Goal: Interest or engagement in activities will improve °Outcome: Progressing °  °

## 2023-02-28 NOTE — Group Note (Signed)
Date:  02/28/2023 Time:  9:05 PM  Group Topic/Focus:  Wrap-Up Group:   The focus of this group is to help patients review their daily goal of treatment and discuss progress on daily workbooks.    Participation Level:  Active  Participation Quality:  Appropriate and Sharing  Affect:  Appropriate  Cognitive:  Appropriate  Insight: Appropriate  Engagement in Group:  Engaged  Modes of Intervention:  Activity and Socialization  Additional Comments:  The patient stated that she had a good day today. The patient stated that she was able to workout when she went to the gym ans she really enjoyed that. The patient also stated that she receive good and helpful information from the groups that she attend today. The patient stated that her goal for today was to attend groups and she stated that she achieved that goal today. The patient stated that she has the same goal for tomorrow. The patient rated her day a 10/10. The patient participated in the ice breaker activity at the end of the group.   Kennieth Francois 02/28/2023, 9:05 PM

## 2023-02-28 NOTE — [facility] Group Notes (Signed)
Adult Psychoeducational Group Note  Date:  02/28/2023 Time:  4:25 PM  Group Topic/Focus:  Goals Group:   The focus of this group is to help patients establish daily goals to achieve during treatment and discuss how the patient can incorporate goal setting into their daily lives to aide in recovery. Orientation:   The focus of this group is to educate the patient on the purpose and policies of crisis stabilization and provide a format to answer questions about their admission.  The group details unit policies and expectations of patients while admitted.  Participation Level:  Active  Participation Quality:  Appropriate  Affect:  Appropriate  Cognitive:  Appropriate  Insight: Appropriate  Engagement in Group:  Engaged  Modes of Intervention:  Education  Additional Comments:  Pt attended the goals group and remained appropriate and engaged throughout the duration of the group.   Sheran Lawless 02/28/2023, 4:25 PM

## 2023-02-28 NOTE — Progress Notes (Signed)
[facility] MD Progress Note  02/28/2023 6:33 PM Courtney Ashley  MRN:  [id number]  Subjective:  Courtney Ashley states, " I need my medications to get well and to function.  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  She was worked up medically and cleared prior to being discharged, but was taken to the [facility] health urgent care by her aunt on the same day with complaints of auditory and visual hallucinations of a deceased cousin & aunt.  Patient was eventually transferred voluntarily to this hospital for treatment and stabilization of her mental status.   Yesterday the psychiatry team made the following recommendations:  -Continue Abilify 10 mg p.o. 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 She is alert, oriented to person, place, time, and situation.  She presents pleasant with smiles and reports she is okay. Chart reviewed and findings shared with the treatment team and discussed with attending psychiatrist.  Speech is soft, coherent with normal volume.  Reports her mood is improving and added that she heard some soft whispers this morning.  Continues on Abilify 10 mg p.o. daily for psychosis.  Patient denies delusional thinking or paranoia.  Able to focus and answer assessment questions appropriately. No acute discomfort observed.  Endorses having BM today. . Reports that anxiety is at manageable level.  Observe attending therapeutic milieu and group unit activities.  Attention to hygiene is commendable and wearing street clothes today. Nursing staff report patient sleeping about 7.5 hours last night and being restful.   Appetite is good Concentration is improving Energy level is adequate Denies suicidal thoughts and denies suicidal intent or plan.  Denies having any HI.  Denies having psychotic symptoms, however of hearing some soft whispers this morning.  Denies having side effects to current psychiatric medications.   We discussed compliance to current medication regimen.   Patient denies any side effects to her psychotropic medications.   Total Time spent with patient: 35 minutes  Past Psychiatric History: Previous Psych Diagnoses: Postpartum depression as per patient, and GAD Prior inpatient treatment: Denies Current/prior outpatient treatment: Reports a history of going to [facility] in [place], but does not always follow up with treatment in the community. Prior rehab hx: Denies Psychotherapy hx: In the past, but not helpful, wants trauma focused care prior to discharge. History of suicide attempts: Denies  History of homicide or aggression: Intense anger outbursts which are recurrent Psychiatric  medication history: Prozac in the past as stated to [facility] counselor, unable to recall medications with Clinical research associate Psychiatric medication compliance history: Noncompliant historically Neuromodulation history: None Current Psychiatrist: None Current therapist: None   Past Medical History:  Past Medical History:  Diagnosis Date   ADHD (attention deficit hyperactivity disorder)    childhood   Anemia    Nexplanon in place 2018    Past Surgical History:  Procedure Laterality Date   TONSILLECTOMY AND ADENOIDECTOMY     Family History:  Family History  Problem Relation Age of Onset   Diabetes Maternal Grandmother    Family Psychiatric  History: See H&P  Social History:  Social History   Substance and Sexual Activity  Alcohol Use Not Currently     Social History   Substance and Sexual Activity  Drug Use Yes   Types: Marijuana    Social History   Socioeconomic History   Marital status: Single    Spouse name: Not on file   Number of children: Not on file   Years of education: Not on file   Highest education level: Not on file  Occupational History   Not on file  Tobacco Use   Smoking status: Some Days    Types: Cigarettes   Smokeless tobacco: Never  Vaping Use   Vaping status: Never Used  Substance and Sexual Activity   Alcohol use: Not Currently   Drug use: Yes    Types: Marijuana   Sexual activity: Yes    Birth control/protection: Implant  Other Topics Concern   Not on file  Social History Narrative   Not on file   Social Determinants of Health   Financial Resource Strain: Not on file  Food Insecurity: No Food Insecurity (02/24/2023)   Hunger Vital Sign    Worried About Running Out of Food in the Last Year: Never true    Ran Out of Food in the Last Year: Never true  Transportation Needs: No Transportation Needs (02/24/2023)   PRAPARE - Administrator, Civil Service (Medical): No    Lack of Transportation (Non-Medical): No  Physical Activity: Not on  file  Stress: Not on file  Social Connections: Not on file   Additional Social History:    Sleep: Good  Appetite:  Good  Current Medications: Current Facility-Administered Medications  Medication Dose Route Frequency Provider Last Rate Last Admin   acetaminophen (TYLENOL) tablet 650 mg  650 mg Oral Q6H PRN Onuoha, Chinwendu V, NP   650 mg at 02/28/23 1635   alum & mag hydroxide-simeth (MAALOX/MYLANTA) 200-200-20 MG/5ML suspension 30 mL  30 mL Oral Q4H PRN Onuoha, Chinwendu V, NP       ARIPiprazole (ABILIFY) tablet 10 mg  10 mg Oral Daily Petula Rotolo C, FNP   10 mg at 02/28/23 0835   cephALEXin (KEFLEX) capsule 500 mg  500 mg Oral Q12H Nkwenti, Doris, NP   500 mg at 02/28/23 7564   cholecalciferol (VITAMIN D3) tablet 1,000 Units  1,000 Units Oral QHS Cecilie Lowers, FNP   1,000 Units at 02/27/23 2046   diphenhydrAMINE (BENADRYL) capsule 50 mg  50 mg Oral TID PRN Onuoha, Chinwendu V, NP       Or   diphenhydrAMINE (BENADRYL) injection 50 mg  50 mg Intramuscular TID PRN Onuoha, Chinwendu V, NP       EPINEPHrine (EPI-PEN) injection 0.3 mg  0.3 mg Intramuscular Daily PRN Starleen Blue,  NP       feeding supplement (ENSURE ENLIVE / ENSURE PLUS) liquid 237 mL  237 mL Oral BID BM Attiah, Nadir, MD   237 mL at 02/28/23 1416   haloperidol (HALDOL) tablet 5 mg  5 mg Oral TID PRN Onuoha, Chinwendu V, NP       Or   haloperidol lactate (HALDOL) injection 5 mg  5 mg Intramuscular TID PRN Onuoha, Chinwendu V, NP       hydrOXYzine (ATARAX) tablet 25 mg  25 mg Oral TID PRN Onuoha, Chinwendu V, NP   25 mg at 02/27/23 2046   LORazepam (ATIVAN) tablet 2 mg  2 mg Oral TID PRN Onuoha, Chinwendu V, NP       Or   LORazepam (ATIVAN) injection 2 mg  2 mg Intramuscular TID PRN Onuoha, Chinwendu V, NP       magnesium hydroxide (MILK OF MAGNESIA) suspension 30 mL  30 mL Oral Daily PRN Onuoha, Chinwendu V, NP   30 mL at 02/25/23 1019   nicotine (NICODERM CQ - dosed in mg/24 hours) patch 21 mg  21 mg Transdermal Daily  Nkwenti, Doris, NP   21 mg at 02/25/23 0756   nicotine polacrilex (NICORETTE) gum 2 mg  2 mg Oral PRN Starleen Blue, NP       sertraline (ZOLOFT) tablet 50 mg  50 mg Oral Daily Nkwenti, Doris, NP   50 mg at 02/28/23 0835   traZODone (DESYREL) tablet 50 mg  50 mg Oral QHS PRN Onuoha, Chinwendu V, NP   50 mg at 02/27/23 2046   Lab Results:  No results found for this or any previous visit (from the past 48 hour(s)).  Blood Alcohol level:  Lab Results  Component Value Date   ETH <10 02/23/2023   ETH 221 (H) 09/18/2022   Metabolic Disorder Labs: Lab Results  Component Value Date   HGBA1C 5.4 02/25/2023   MPG 108.28 02/25/2023   No results found for: "PROLACTIN" Lab Results  Component Value Date   CHOL 150 02/25/2023   TRIG 84 02/25/2023   HDL 50 02/25/2023   CHOLHDL 3.0 02/25/2023   VLDL 17 02/25/2023   LDLCALC 83 02/25/2023   Physical Findings: AIMS: Facial and Oral Movements Muscles of Facial Expression: None, normal Lips and Perioral Area: None, normal Jaw: None, normal Tongue: None, normal,Extremity Movements Upper (arms, wrists, hands, fingers): None, normal Lower (legs, knees, ankles, toes): None, normal, Trunk Movements Neck, shoulders, hips: None, normal, Overall Severity Severity of abnormal movements (highest score from questions above): None, normal Incapacitation due to abnormal movements: None, normal Patient's awareness of abnormal movements (rate only patient's report): No Awareness, Dental Status Current problems with teeth and/or dentures?: No Does patient usually wear dentures?: No  CIWA:    COWS:     Musculoskeletal: Strength & Muscle Tone: within normal limits Gait & Station: normal Patient leans: N/A  Psychiatric Specialty Exam:  Presentation  General Appearance:  Casual; Fairly Groomed  Eye Contact: Good  Speech: Clear and Coherent  Speech Volume: Normal  Handedness: Right  Mood and Affect   Mood: Euthymic  Affect: Congruent  Thought Process  Thought Processes: Coherent  Descriptions of Associations:Intact  Orientation:Full (Time, Place and Person)  Thought Content:Logical  History of Schizophrenia/Schizoaffective disorder:No  Duration of Psychotic Symptoms:Less than six months  Hallucinations:Hallucinations: None Description of Auditory Hallucinations: Denies  Ideas of Reference:None  Suicidal Thoughts:Suicidal Thoughts: No  Homicidal Thoughts:Homicidal Thoughts: No  Sensorium  Memory: Immediate Good; Recent Good  Judgment:  Fair  Insight: Adult nurse: Fair  Attention Span: Fair  Recall: [employer] of Knowledge: Fair  Language: Good  Psychomotor Activity  Psychomotor Activity: Psychomotor Activity: Normal  Assets  Assets: Communication Skills; Desire for Improvement; Physical Health; Resilience; Social Support  Sleep  Sleep: Sleep: Good Number of Hours of Sleep: 7.5  Physical Exam: Physical Exam Vitals and nursing note reviewed.  HENT:     Head: Normocephalic.     Nose: Nose normal.     Mouth/Throat:     Mouth: Mucous membranes are moist.  Eyes:     Extraocular Movements: Extraocular movements intact.  Cardiovascular:     Rate and Rhythm: Normal rate.     Pulses: Normal pulses.  Pulmonary:     Effort: Pulmonary effort is normal.  Abdominal:     Comments: Deferred  Genitourinary:    Comments: Deferred Musculoskeletal:        General: Normal range of motion.     Cervical back: Normal range of motion.  Skin:    General: Skin is warm.  Neurological:     Mental Status: She is alert and oriented to person, place, and time.  Psychiatric:        Mood and Affect: Mood normal.        Behavior: Behavior normal.        Thought Content: Thought content normal.    Review of Systems  Constitutional:  Negative for chills and fever.  HENT:  Negative for sore throat.   Eyes:  Negative for  blurred vision.  Respiratory:  Negative for cough, shortness of breath and wheezing.   Cardiovascular:  Negative for chest pain and palpitations.  Gastrointestinal:  Negative for abdominal pain, heartburn, nausea and vomiting.  Genitourinary:  Negative for dysuria, frequency and urgency.  Musculoskeletal: Negative.   Skin:  Negative for rash.  Neurological:  Negative for dizziness, tingling, tremors, sensory change and headaches.  Endo/Heme/Allergies:        See allergy listing  Psychiatric/Behavioral:  The patient is nervous/anxious.    Blood pressure 139/71, pulse 69, temperature 99 F (37.2 C), temperature source Oral, resp. rate 14, height 5\' 5"  (1.651 m), weight 56.2 kg, SpO2 100%. Body mass index is 20.63 kg/m.  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    EKG with QTc 410   Other PRNS -Continue Tylenol 650 mg every 6 hours PRN for mild pain -Continue Maalox 30 mg every  4 hrs PRN for indigestion -Continue Milk of Magnesia as needed every 6 hrs for constipation   Discharge Planning: Social work and case management to assist with discharge planning and identification of hospital follow-up needs prior to discharge Estimated LOS: 5-7 days Discharge Concerns: Need to establish a safety plan; Medication compliance and effectiveness Discharge Goals: Return home with outpatient referrals for mental health follow-up including medication management/psychotherapy   I certify that inpatient services furnished can reasonably be expected to improve the patient's condition.      Cecilie Lowers, FNP 02/28/2023, 6:33 PM Patient ID: Marylynn Pearson, female   DOB: 07/05/2000, 22 y.o.   MRN: [id number] Patient ID: Nadalee Haydel, female   DOB: 06-09-2000, 22 y.o.   MRN: [id number] Patient ID: Toinette Buckaloo, female   DOB: [birth year], 22 y.o.   MRN: [id number]

## 2023-02-28 NOTE — Progress Notes (Signed)
   02/28/23 2130  Psych Admission Type (Psych Patients Only)  Admission Status Voluntary  Psychosocial Assessment  Patient Complaints None  Eye Contact Fair  Facial Expression Flat  Affect Anxious  Speech Logical/coherent  Interaction Assertive;Cautious  Motor Activity Slow  Appearance/Hygiene Unremarkable  Behavior Characteristics Cooperative  Mood Pleasant  Aggressive Behavior  Effect No apparent injury  Thought Process  Coherency WDL  Content Preoccupation  Delusions Paranoid  Perception WDL  Hallucination None reported or observed  Judgment Impaired  Confusion None  Danger to Self  Current suicidal ideation? Denies

## 2023-02-28 NOTE — Progress Notes (Signed)
   02/28/23 0845  Psych Admission Type (Psych Patients Only)  Admission Status Voluntary  Psychosocial Assessment  Patient Complaints None  Eye Contact Fair  Facial Expression Animated  Affect Appropriate to circumstance  Speech Logical/coherent  Interaction Assertive  Motor Activity Slow  Appearance/Hygiene Unremarkable  Behavior Characteristics Cooperative;Appropriate to situation  Mood Pleasant  Thought Process  Coherency WDL  Content Preoccupation  Delusions Paranoid  Perception WDL  Hallucination None reported or observed  Judgment Impaired  Confusion None  Danger to Self  Current suicidal ideation? Denies  Agreement Not to Harm Self Yes  Description of Agreement Verbal  Danger to Others  Danger to Others None reported or observed

## 2023-03-01 ENCOUNTER — Encounter (HOSPITAL_COMMUNITY): Payer: Self-pay

## 2023-03-01 DIAGNOSIS — F3181 Bipolar II disorder: Secondary | ICD-10-CM | POA: Diagnosis not present

## 2023-03-01 NOTE — Progress Notes (Signed)
Pt did not attend NA group 

## 2023-03-01 NOTE — Progress Notes (Signed)
   03/01/23 1145  Psych Admission Type (Psych Patients Only)  Admission Status Voluntary  Psychosocial Assessment  Patient Complaints None  Eye Contact Fair  Facial Expression Animated  Affect Appropriate to circumstance  Speech Logical/coherent  Interaction Assertive  Motor Activity Slow  Appearance/Hygiene Unremarkable  Behavior Characteristics Cooperative;Appropriate to situation  Mood Pleasant  Thought Process  Coherency WDL  Content Preoccupation  Delusions Paranoid  Perception WDL  Hallucination None reported or observed  Judgment Impaired  Confusion None  Danger to Self  Current suicidal ideation? Denies  Agreement Not to Harm Self Yes  Description of Agreement Verbal  Danger to Others  Danger to Others None reported or observed

## 2023-03-01 NOTE — BH IP Treatment Plan (Signed)
Interdisciplinary Treatment and Diagnostic Plan Update  03/01/2023 Time of Session: 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, mood-congruent (HCC)  Secondary Diagnoses: Principal Problem:   Severe bipolar II disorder, 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, mood-congruent (HCC) Active Problems:   Insomnia   GAD (generalized anxiety disorder)   Delta-9-tetrahydrocannabinol (THC) dependence (HCC)   Tobacco dependence   Current Medications:  Current Facility-Administered Medications  Medication Dose Route Frequency Provider Last Rate Last Admin   acetaminophen (TYLENOL) tablet 650 mg  650 mg Oral Q6H PRN Onuoha, Chinwendu V, NP   650 mg at 02/28/23 1635   alum & mag hydroxide-simeth (MAALOX/MYLANTA) 200-200-20 MG/5ML suspension 30 mL  30 mL Oral Q4H PRN Onuoha, Chinwendu V, NP       ARIPiprazole (ABILIFY) tablet 10 mg  10 mg Oral Daily Ntuen, Jesusita Oka, FNP   10 mg at 03/01/23 0820   cephALEXin (KEFLEX) capsule 500 mg  500 mg Oral Q12H Starleen Blue, NP   500 mg at 03/01/23 0820   cholecalciferol (VITAMIN D3) tablet 1,000 Units  1,000 Units Oral QHS Cecilie Lowers, FNP   1,000 Units at 02/28/23 2041   diphenhydrAMINE (BENADRYL) capsule 50 mg  50 mg Oral TID PRN Onuoha, Chinwendu V, NP       Or   diphenhydrAMINE (BENADRYL) injection 50 mg  50 mg Intramuscular TID PRN Onuoha, Chinwendu V, NP       EPINEPHrine (EPI-PEN) injection 0.3 mg  0.3 mg Intramuscular Daily PRN Nkwenti, Doris, NP       feeding supplement (ENSURE ENLIVE / ENSURE PLUS) liquid 237 mL  237 mL Oral BID BM Attiah, Nadir, MD   237 mL at 03/01/23 0925   haloperidol (HALDOL) tablet 5 mg  5 mg Oral TID PRN Onuoha, Chinwendu V, NP       Or   haloperidol lactate (HALDOL) injection 5 mg  5 mg Intramuscular TID PRN Onuoha, Chinwendu V, NP       hydrOXYzine (ATARAX) tablet 25 mg  25 mg Oral TID PRN Onuoha,  Chinwendu V, NP   25 mg at 02/28/23 2041   LORazepam (ATIVAN) tablet 2 mg  2 mg Oral TID PRN Onuoha, Chinwendu V, NP       Or   LORazepam (ATIVAN) injection 2 mg  2 mg Intramuscular TID PRN Onuoha, Chinwendu V, NP       magnesium hydroxide (MILK OF MAGNESIA) suspension 30 mL  30 mL Oral Daily PRN Onuoha, Chinwendu V, NP   30 mL at 02/25/23 1019   nicotine (NICODERM CQ - dosed in mg/24 hours) patch 21 mg  21 mg Transdermal Daily Nkwenti, Doris, NP   21 mg at 02/25/23 0756   nicotine polacrilex (NICORETTE) gum 2 mg  2 mg Oral PRN Starleen Blue, NP       sertraline (ZOLOFT) tablet 50 mg  50 mg Oral Daily Nkwenti, Doris, NP   50 mg at 03/01/23 0820   traZODone (DESYREL) tablet 50 mg  50 mg Oral QHS PRN Onuoha, Chinwendu V, NP   50 mg at 02/28/23 2041   PTA Medications: No medications prior to admission.    Patient Stressors: Medication change or noncompliance   Other: "herself"    Patient Strengths: Average or above average intelligence  Communication skills  General fund of knowledge  Motivation for treatment/growth  Supportive family/friends   Treatment Modalities: Medication Management, Group therapy, Case management,  1 to 1 session with clinician, Psychoeducation, Recreational therapy.   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, mood-congruent (HCC) Long Term Goal(s): Improvement in symptoms so as ready for discharge   Short Term Goals: Ability to identify changes in lifestyle to reduce recurrence of condition will improve Ability to verbalize feelings will improve Ability to disclose and discuss suicidal ideas Ability to demonstrate self-control will improve Ability to identify and develop effective coping behaviors will improve Ability to maintain clinical measurements within normal limits will improve Compliance with prescribed medications will improve  Medication Management:  Evaluate patient's response, side effects, and tolerance of medication regimen.  Therapeutic Interventions: 1 to 1 sessions, Unit Group sessions and Medication administration.  Evaluation of Outcomes: Progressing  Physician Treatment Plan for Secondary Diagnosis: Principal Problem:   Severe bipolar II disorder, 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, mood-congruent (HCC) Active Problems:   Insomnia   GAD (generalized anxiety disorder)   Delta-9-tetrahydrocannabinol (THC) dependence (HCC)   Tobacco dependence  Long Term Goal(s): Improvement in symptoms so as ready for discharge   Short Term Goals: Ability to identify changes in lifestyle to reduce recurrence of condition will improve Ability to verbalize feelings will improve Ability to disclose and discuss suicidal ideas Ability to demonstrate self-control will improve Ability to identify and develop effective coping behaviors will improve Ability to maintain clinical measurements within normal limits will improve Compliance with prescribed medications will improve     Medication Management: Evaluate patient's response, side effects, and tolerance of medication regimen.  Therapeutic Interventions: 1 to 1 sessions, Unit Group sessions and Medication administration.  Evaluation of Outcomes: Progressing   RN Treatment Plan for Primary Diagnosis: Severe bipolar II disorder, 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, mood-congruent (HCC) Long Term Goal(s): Knowledge of disease and therapeutic regimen to maintain health will improve  Short Term Goals: Ability to remain free from injury will improve, Ability to verbalize frustration and anger appropriately will improve, Ability to demonstrate self-control, Ability to participate in decision making will improve, Ability to verbalize feelings will improve, Ability to disclose and discuss suicidal ideas, Ability to identify and develop effective coping  behaviors will improve, and Compliance with prescribed medications will improve  Medication Management: RN will administer medications as ordered by provider, will assess and evaluate patient's response and provide education to patient for prescribed medication. RN will report any adverse and/or side effects to prescribing provider.  Therapeutic Interventions: 1 on 1 counseling sessions, Psychoeducation, Medication administration, Evaluate responses to treatment, Monitor vital signs and CBGs as ordered, Perform/monitor CIWA, COWS, AIMS and Fall Risk screenings as ordered, Perform wound care treatments as ordered.  Evaluation of Outcomes: Progressing   LCSW Treatment Plan for Primary Diagnosis: Severe bipolar II disorder, 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, mood-congruent (HCC) Long Term Goal(s): Safe transition to appropriate next level of care at discharge, Engage patient in therapeutic group addressing interpersonal concerns.  Short Term Goals: Engage patient in aftercare planning with referrals and resources, Increase social support, Increase ability to appropriately verbalize feelings, Increase emotional regulation, Facilitate acceptance of mental health diagnosis and concerns, Facilitate patient progression through stages of change regarding substance use diagnoses and concerns, Identify triggers associated with mental health/substance abuse issues, and Increase skills for wellness and recovery  Therapeutic Interventions: Assess for all discharge needs, 1 to 1 time with Social worker, Explore available resources and support systems, Assess for adequacy in  community support network, Educate family and significant other(s) on suicide prevention, Complete Psychosocial Assessment, Interpersonal group therapy.  Evaluation of Outcomes: Progressing   Progress in Treatment: Attending groups: Yes. Participating in groups: Yes. Taking medication as prescribed:  Yes. Toleration medication: Yes. Family/Significant other contact made: Yes, individual(s) contacted:  -[name] [phone])  Patient understands diagnosis: Yes. Discussing patient identified problems/goals with staff: Yes. Medical problems stabilized or resolved: Yes. Denies suicidal/homicidal ideation: Yes. Issues/concerns per patient self-inventory: Yes. Other: N/A  New problem(s) identified: No, Describe:  None reported  New Short Term/Long Term Goal(s): medication stabilization, elimination of SI thoughts, development of comprehensive mental wellness plan.   Patient Goals:  Medication Stabilization  Discharge Plan or Barriers: Patient recently admitted. CSW will continue to follow and assess for appropriate referrals and possible discharge planning.   Reason for Continuation of Hospitalization: Anxiety Depression Mania Medication stabilization Suicidal ideation Withdrawal symptoms  Estimated Length of Stay:3-7 Days  Last 3 [place] Suicide Severity Risk Score: Flowsheet Row Ad[facility] (Current) [facility] Most recent reading at 02/24/2023  1:30 AM ED from 02/23/2023 in [facility] Most recent reading at 02/23/2023  5:46 PM ED from 02/23/2023 in [facility] Most recent reading at 02/23/2023  9:31 AM  C-SSRS RISK CATEGORY No Risk No Risk No Risk       Last PHQ 2/9 Scores:    08/12/2019    3:18 PM 02/22/2019   10:11 AM 11/21/2018    9:31 AM  Depression screen PHQ 2/9  Decreased Interest 3 0 0  Down, Depressed, Hopeless 1 0 0  PHQ - 2 Score 4 0 0  Altered sleeping 1    Tired, decreased energy 1    Change in appetite 3    Feeling bad or failure about yourself  1    Trouble concentrating 1    Moving slowly or fidgety/restless 1    Suicidal thoughts 0    PHQ-9 Score 12     detox, medication management for mood stabilization;  elimination of SI thoughts; development of comprehensive mental wellness/sobriety plan    Scribe for Treatment Team: Ane Payment, LCSW 03/01/2023 1:36 PM

## 2023-03-01 NOTE — [facility] Group Notes (Signed)
Adult Psychoeducational Group Note  Date:  03/01/2023 Time:  9:40 AM  Group Topic/Focus:  Goals Group:   The focus of this group is to help patients establish daily goals to achieve during treatment and discuss how the patient can incorporate goal setting into their daily lives to aide in recovery. Orientation:   The focus of this group is to educate the patient on the purpose and policies of crisis stabilization and provide a format to answer questions about their admission.  The group details unit policies and expectations of patients while admitted.  Participation Level:  Did Not Attend  Participation Quality:    Affect:    Cognitive:    Insight:   Engagement in Group:    Modes of Intervention:    Additional Comments:    Sheran Lawless 03/01/2023, 9:40 AM

## 2023-03-01 NOTE — Plan of Care (Signed)
  Problem: Education: Goal: Mental status will improve Outcome: Progressing Goal: Verbalization of understanding the information provided will improve Outcome: Progressing   Problem: Coping: Goal: Ability to demonstrate self-control will improve Outcome: Progressing

## 2023-03-01 NOTE — [facility] Group Notes (Signed)
Spirituality group facilitated by Kathleen Argue, BCC.   Group Description: Group focused on topic of hope. Patients participated in facilitated discussion around topic, connecting with one another around experiences and definitions for hope. Group members engaged with visual explorer photos, reflecting on what hope looks like for them today. Group engaged in discussion around how their definitions of hope are present today in hospital.   Modalities: Psycho-social ed, Adlerian, Narrative, MI   Patient Progress: Did not attend.

## 2023-03-01 NOTE — Progress Notes (Signed)
[facility] MD Progress Note  03/01/2023 12:09 PM Courtney Ashley  MRN:  [id number]  Subjective:  Courtney Ashley states, " I need my medications to get well and to function.  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  She was worked up medically and cleared prior to being discharged, but was taken to the [facility] health urgent care by her aunt on the same day with complaints of auditory and visual hallucinations of a deceased cousin & aunt.  Patient was eventually transferred voluntarily to this hospital for treatment and stabilization of her mental status.   Yesterday the psychiatry team made the following recommendations:  -Continue Abilify 10 mg p.o. 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 She is alert, oriented to person, place, time, and situation.  She presents pleasant with smiles and reports she is 'fine'. Chart reviewed and findings shared with the treatment team and discussed with attending psychiatrist.  Speech is clear, coherent with normal volume and pattern.  She denies SI, HI, or AVH.  Patient continues on Abilify 10 mg p.o. daily for mood stabilization.  She denies delusional thinking or paranoia.  Able to focus and answer assessment questions appropriately. No acute discomfort observed.  Endorses having BM today.  This provider shared with the patient that  her expected date of discharge on Friday, 03/03/2023.  Patient reports she is excited about plan to discharge her back to home.  As per discussion in the treatment team, patient may be stepping down from [address] to possibly 400 due to her being psychiatrically stable.  No changes made to her treatment plan today, she continues on her psychotropic medications as indicated above.  Collateral information: Attempted to reach patient's grandmother Courtney Ashley at [phone] per patient's permission, however unable to reach this time. HIPPA protected message left on her voicemail.  Will attempt to call patient's grandmother again to alert her regarding discharge on Friday. . Reports that anxiety is at manageable level.  Observe attending therapeutic milieu and group unit activities.  Attention to hygiene is commendable and wearing street clothes today. Nursing staff report patient sleeping about 7.5 hours last night and being restful.   Appetite is good Concentration is improving Energy level is adequate Denies suicidal thoughts and denies suicidal intent or plan.  Denies having any  HI.  Denies having psychotic symptoms, however of hearing some soft whispers this morning.  Denies having side effects to  current psychiatric medications.   We discussed compliance to current medication regimen.   Patient denies any side effects to her psychotropic medications.   Total Time spent with patient: 35 minutes  Past Psychiatric History: Previous Psych Diagnoses: Postpartum depression as per patient, and GAD Prior inpatient treatment: Denies Current/prior outpatient treatment: Reports a history of going to [facility] in [place], but does not always follow up with treatment in the community. Prior rehab hx: Denies Psychotherapy hx: In the past, but not helpful, wants trauma focused care prior to discharge. History of suicide attempts: Denies History of homicide or aggression: Intense anger outbursts which are recurrent Psychiatric medication history: Prozac in the past as stated to [facility] counselor, unable to recall medications with Clinical research associate Psychiatric medication compliance history: Noncompliant historically Neuromodulation history: None Current Psychiatrist: None Current therapist: None   Past Medical History:  Past Medical History:  Diagnosis Date   ADHD (attention deficit hyperactivity disorder)    childhood   Anemia    Nexplanon in place 2018    Past Surgical History:  Procedure Laterality Date   TONSILLECTOMY AND ADENOIDECTOMY     Family History:  Family History  Problem Relation Age of Onset   Diabetes Maternal Grandmother    Family Psychiatric  History: See H&P  Social History:  Social History   Substance and Sexual Activity  Alcohol Use Not Currently     Social History   Substance and Sexual Activity  Drug Use Yes   Types: Marijuana    Social History   Socioeconomic History   Marital status: Single    Spouse name: Not on file   Number of children: Not on file   Years of education: Not on file   Highest education level: Not on file  Occupational History   Not on file  Tobacco Use   Smoking status: Some Days    Types: Cigarettes   Smokeless tobacco: Never   Vaping Use   Vaping status: Never Used  Substance and Sexual Activity   Alcohol use: Not Currently   Drug use: Yes    Types: Marijuana   Sexual activity: Yes    Birth control/protection: Implant  Other Topics Concern   Not on file  Social History Narrative   Not on file   Social Determinants of Health   Financial Resource Strain: Not on file  Food Insecurity: No Food Insecurity (02/24/2023)   Hunger Vital Sign    Worried About Running Out of Food in the Last Year: Never true    Ran Out of Food in the Last Year: Never true  Transportation Needs: No Transportation Needs (02/24/2023)   PRAPARE - Administrator, Civil Service (Medical): No    Lack of Transportation (Non-Medical): No  Physical Activity: Not on file  Stress: Not on file  Social Connections: Not on file   Additional Social History:    Sleep: Good  Appetite:  Good  Current Medications: Current Facility-Administered Medications  Medication Dose Route Frequency Provider Last Rate Last Admin   acetaminophen (TYLENOL) tablet 650 mg  650 mg Oral Q6H PRN Onuoha, Chinwendu V, NP   650 mg at 02/28/23 1635   alum & mag hydroxide-simeth (MAALOX/MYLANTA) 200-200-20 MG/5ML suspension 30 mL  30 mL Oral Q4H PRN Onuoha, Chinwendu V, NP       ARIPiprazole (ABILIFY) tablet 10 mg  10 mg Oral Daily  Cecilie Lowers, FNP   10 mg at 03/01/23 0820   cephALEXin (KEFLEX) capsule 500 mg  500 mg Oral Q12H Starleen Blue, NP   500 mg at 03/01/23 0820   cholecalciferol (VITAMIN D3) tablet 1,000 Units  1,000 Units Oral QHS Cecilie Lowers, FNP   1,000 Units at 02/28/23 2041   diphenhydrAMINE (BENADRYL) capsule 50 mg  50 mg Oral TID PRN Onuoha, Chinwendu V, NP       Or   diphenhydrAMINE (BENADRYL) injection 50 mg  50 mg Intramuscular TID PRN Onuoha, Chinwendu V, NP       EPINEPHrine (EPI-PEN) injection 0.3 mg  0.3 mg Intramuscular Daily PRN Nkwenti, Tyler Aas, NP       feeding supplement (ENSURE ENLIVE / ENSURE PLUS) liquid 237 mL  237 mL  Oral BID BM Attiah, Nadir, MD   237 mL at 02/28/23 1416   haloperidol (HALDOL) tablet 5 mg  5 mg Oral TID PRN Onuoha, Chinwendu V, NP       Or   haloperidol lactate (HALDOL) injection 5 mg  5 mg Intramuscular TID PRN Onuoha, Chinwendu V, NP       hydrOXYzine (ATARAX) tablet 25 mg  25 mg Oral TID PRN Onuoha, Chinwendu V, NP   25 mg at 02/28/23 2041   LORazepam (ATIVAN) tablet 2 mg  2 mg Oral TID PRN Onuoha, Chinwendu V, NP       Or   LORazepam (ATIVAN) injection 2 mg  2 mg Intramuscular TID PRN Onuoha, Chinwendu V, NP       magnesium hydroxide (MILK OF MAGNESIA) suspension 30 mL  30 mL Oral Daily PRN Onuoha, Chinwendu V, NP   30 mL at 02/25/23 1019   nicotine (NICODERM CQ - dosed in mg/24 hours) patch 21 mg  21 mg Transdermal Daily Nkwenti, Doris, NP   21 mg at 02/25/23 0756   nicotine polacrilex (NICORETTE) gum 2 mg  2 mg Oral PRN Starleen Blue, NP       sertraline (ZOLOFT) tablet 50 mg  50 mg Oral Daily Nkwenti, Doris, NP   50 mg at 03/01/23 0820   traZODone (DESYREL) tablet 50 mg  50 mg Oral QHS PRN Onuoha, Chinwendu V, NP   50 mg at 02/28/23 2041   Lab Results:  No results found for this or any previous visit (from the past 48 hour(s)).  Blood Alcohol level:  Lab Results  Component Value Date   ETH <10 02/23/2023   ETH 221 (H) 09/18/2022   Metabolic Disorder Labs: Lab Results  Component Value Date   HGBA1C 5.4 02/25/2023   MPG 108.28 02/25/2023   No results found for: "PROLACTIN" Lab Results  Component Value Date   CHOL 150 02/25/2023   TRIG 84 02/25/2023   HDL 50 02/25/2023   CHOLHDL 3.0 02/25/2023   VLDL 17 02/25/2023   LDLCALC 83 02/25/2023   Physical Findings: AIMS: Facial and Oral Movements Muscles of Facial Expression: None, normal Lips and Perioral Area: None, normal Jaw: None, normal Tongue: None, normal,Extremity Movements Upper (arms, wrists, hands, fingers): None, normal Lower (legs, knees, ankles, toes): None, normal, Trunk Movements Neck, shoulders,  hips: None, normal, Overall Severity Severity of abnormal movements (highest score from questions above): None, normal Incapacitation due to abnormal movements: None, normal Patient's awareness of abnormal movements (rate only patient's report): No Awareness, Dental Status Current problems with teeth and/or dentures?: No Does patient usually wear dentures?: No  CIWA:    COWS:     Musculoskeletal: Strength &  Muscle Tone: within normal limits Gait & Station: normal Patient leans: N/A  Psychiatric Specialty Exam:  Presentation  General Appearance:  Appropriate for Environment; Casual; Fairly Groomed  Eye Contact: Good  Speech: Clear and Coherent; Normal Rate  Speech Volume: Normal  Handedness: Right  Mood and Affect  Mood: Euthymic  Affect: Appropriate; Congruent  Thought Process  Thought Processes: Coherent  Descriptions of Associations:Intact  Orientation:Full (Time, Place and Person)  Thought Content:Logical  History of Schizophrenia/Schizoaffective disorder:No  Duration of Psychotic Symptoms:Less than six months  Hallucinations:Hallucinations: None Description of Auditory Hallucinations: Denies  Ideas of Reference:None  Suicidal Thoughts:Suicidal Thoughts: No  Homicidal Thoughts:Homicidal Thoughts: No  Sensorium  Memory: Immediate Good; Recent Good  Judgment: Fair  Insight: Fair  Art therapist  Concentration: Good  Attention Span: Good  Recall: Fair  Fund of Knowledge: Fair  Language: Good  Psychomotor Activity  Psychomotor Activity: Psychomotor Activity: Normal  Assets  Assets: Communication Skills; Desire for Improvement; Housing; Physical Health; Resilience; Social Support  Sleep  Sleep: Sleep: Good Number of Hours of Sleep: 8.5  Physical Exam: Physical Exam Vitals and nursing note reviewed.  HENT:     Head: Normocephalic.     Nose: Nose normal.     Mouth/Throat:     Mouth: Mucous membranes are  moist.  Eyes:     Extraocular Movements: Extraocular movements intact.  Cardiovascular:     Rate and Rhythm: Normal rate.     Pulses: Normal pulses.  Pulmonary:     Effort: Pulmonary effort is normal.  Abdominal:     Comments: Deferred  Genitourinary:    Comments: Deferred Musculoskeletal:        General: Normal range of motion.     Cervical back: Normal range of motion.  Skin:    General: Skin is warm.  Neurological:     Mental Status: She is alert and oriented to person, place, and time.  Psychiatric:        Mood and Affect: Mood normal.        Behavior: Behavior normal.        Thought Content: Thought content normal.    Review of Systems  Constitutional:  Negative for chills and fever.  HENT:  Negative for sore throat.   Eyes:  Negative for blurred vision.  Respiratory:  Negative for cough, shortness of breath and wheezing.   Cardiovascular:  Negative for chest pain and palpitations.  Gastrointestinal:  Negative for abdominal pain, heartburn, nausea and vomiting.  Genitourinary:  Negative for dysuria, frequency and urgency.  Musculoskeletal: Negative.   Skin:  Negative for rash.  Neurological:  Negative for dizziness, tingling, tremors, sensory change and headaches.  Endo/Heme/Allergies:        See allergy listing  Psychiatric/Behavioral:  The patient is nervous/anxious.    Blood pressure 128/67, pulse 66, temperature 98.2 F (36.8 C), temperature source Oral, resp. rate 16, height 5\' 5"  (1.651 m), weight 56.2 kg, SpO2 100%. Body mass index is 20.63 kg/m.  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   EKG with QTc 410   Other PRNS -Continue Tylenol 650 mg every 6 hours PRN for mild pain -Continue Maalox 30 mg every 4 hrs PRN for indigestion -Continue Milk of Magnesia as needed every 6 hrs for constipation   Discharge Planning: Social work and case management to assist with discharge planning and identification of hospital follow-up needs prior to discharge Estimated LOS: 5-7 days Discharge Concerns: Need to establish a safety plan; Medication compliance and effectiveness Discharge Goals: Return home with outpatient referrals for mental health follow-up including medication management/psychotherapy   I certify that inpatient services furnished can reasonably be expected to improve the patient's condition.      Cecilie Lowers, FNP 03/01/2023, 12:09 PM Patient ID: Courtney Ashley, female   DOB: March 16, 2001, 22 y.o.   MRN: [id number] Patient ID: Courtney Ashley, female   DOB: 2000/12/11, 22 y.o.   MRN: [id number] Patient ID: Courtney Ashley, female   DOB: 2000-06-13, 21 y.o.   MRN: [id number] Patient ID: Courtney Ashley, female   DOB: 21-Apr-2001, 22 y.o.   MRN: [id number]

## 2023-03-01 NOTE — Group Note (Signed)
Recreation Therapy Group Note   Group Topic:Self-Esteem  Group Date: 03/01/2023 Start Time: 1014 End Time: 1055 Facilitators: Kitt Minardi-McCall, LRT,CTRS Location: [address] Dayroom   Group Topic: Self Esteem    Goal Area(s) Addresses:  Patient will appropriately identify what self esteem is.  Patient will create a shield of armor describing themselves.  Patient will successfully identify positive attributes about themselves.  Patient will acknowledge benefit of improved self-esteem.  Patient will follow instructions on 1st prompt.    Intervention / Activity: Self-Esteem Shield. Patient attended a recreation therapy group session focused on self esteem. Patient identified what self esteem is, and why it is important to have high self esteem during group discussion. LRT gave patiens a sheet with a breakdown of what each quadrant was to represent. Patient was asked to create their own shield to show off their unique attributes, four quadrants reflected the following:  The Upper Left quadrant- reasons they are unique/special. The Upper Right quadrant- things that they love to do. The Lower Left quadrant- goals for their future. The Lower Right quadrant- character words that describe them.    Patients were provided sheets with the shield printed on them and colored pencils, markers and crayons to complete the activity.  Patients and writer had group related discussions while individually working on their activity.  Patients were debriefed on the importance of healthy self esteem and offered a handout for ways to increase self esteem.    Education: Self esteem, Communication, Positive self-talk, Discharge Planning   Education Outcome: Acknowledges education/Verbalizes understanding of Education/In group clarification offered/Needs further education   Affect/Mood: Appropriate   Participation Level: Engaged   Participation Quality: Independent   Behavior: Appropriate    Speech/Thought Process: Focused   Insight: Good   Judgement: Good   Modes of Intervention: Art   Patient Response to Interventions:  Engaged   Education Outcome:  In group clarification offered    Clinical Observations/Individualized Feedback: Pt was engaged and social with peers. Pt broke down her crest as follows: 1st Quadrant: flowers- "family is love/family always stick together through thick and thin" 2nd Quadrant: Music notes-singing/music; cooking pan and dancing shoes-dancing 3rd Quadrant: Self love- ups and downs are ok 4th Quadrant: Choose growth over goals- Berniece Pap     Plan: Continue to engage patient in RT group sessions 2-3x/week.   Kennidy Lamke-McCall, LRT,CTRS 03/01/2023 11:26 AM

## 2023-03-02 ENCOUNTER — Encounter (HOSPITAL_COMMUNITY): Payer: Self-pay | Admitting: Behavioral Health

## 2023-03-02 DIAGNOSIS — F3181 Bipolar II disorder: Secondary | ICD-10-CM | POA: Diagnosis not present

## 2023-03-02 NOTE — Plan of Care (Signed)
  Problem: Education: Goal: Knowledge of [facility] Education information/materials will improve Outcome: Progressing   Problem: Activity: Goal: Interest or engagement in activities will improve Outcome: Progressing   Problem: Coping: Goal: Ability to verbalize frustrations and anger appropriately will improve Outcome: Progressing   

## 2023-03-02 NOTE — Progress Notes (Signed)
[name] Progress Note  03/02/2023 10:56 AM Courtney Ashley  MRN:  [id number]  Subjective:  Courtney Ashley states, " I need my medications to get well and to function.  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  She was worked up medically and cleared prior to being discharged, but was taken to the [facility] health urgent care by her aunt on the same day with complaints of auditory and visual hallucinations of a deceased cousin & aunt.  Patient was eventually transferred voluntarily to this hospital for treatment and stabilization of her mental status.   Yesterday the psychiatry team made the following recommendations:  -Continue Abilify 10 mg p.o. 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  She was alert and oriented cooperative and pleasant and maintained good eye contact.  She continues to endorse being discharged and ready.  She has been taking the Abilify as prescribed which has improved her mood significantly.  She is currently denying any active delusional thinking or paranoia.  She slept better.  Patient is looking forward to discharge on Friday and she plans to go to her grandmother's house.  Apparently her grandmother actually raised her since childhood and she feels comfortable there.  She was recently stepped down from 500 hallway to 400 and continues to improve with no behavioral issues.  If she continues to maintain her current improvement she will be discharged tomorrow with a safety plan.   Collateral information: Collateral information will be obtained from the grandmother.  Attempts to reach her yesterday were not successful but a message was apparently left.  His safety plan with the grandmother will be made prior to discharge.  Grandmother Courtney Ashley at [phone] . Marland Kitchen Reports that anxiety is at manageable level.  Observe attending therapeutic milieu and group unit activities.  Attention to hygiene is commendable and wearing street clothes today. Nursing staff report patient sleeping about 5.5 hours last night and being restful.   Appetite is good Concentration is improving Energy level is adequate Denies suicidal thoughts and denies suicidal intent or plan.  Denies having any HI.  Denies having psychotic symptoms, however of hearing some soft whispers this morning.  Denies having side effects to current psychiatric medications.   We discussed compliance to current medication regimen.   Patient denies any side effects to her psychotropic medications.  Total Time spent with patient: 35 minutes  Past Psychiatric History: Previous Psych Diagnoses: Postpartum depression as per patient, and  GAD Prior inpatient treatment: Denies Current/prior outpatient treatment: Reports a history of going to [facility] in [place], but does not always follow up with treatment in the community. Prior rehab hx: Denies Psychotherapy hx: In the past, but not helpful, wants trauma focused care prior to discharge. History of suicide attempts: Denies History of homicide or aggression: Intense anger outbursts which are recurrent Psychiatric medication history: Prozac in the past as stated to [facility] counselor, unable to recall medications with Clinical research associate Psychiatric medication compliance history: Noncompliant historically Neuromodulation history: None Current Psychiatrist: None Current therapist: None   Past Medical History:  Past Medical History:  Diagnosis Date   ADHD (attention deficit hyperactivity disorder)    childhood   Anemia    Nexplanon in place 2018    Past Surgical History:  Procedure Laterality Date   TONSILLECTOMY AND ADENOIDECTOMY     Family History:  Family History  Problem Relation Age of Onset   Diabetes Maternal Grandmother    Family Psychiatric  History: See H&P  Social History:  Social History   Substance and Sexual Activity  Alcohol Use Not Currently     Social History   Substance and Sexual Activity  Drug Use Yes   Types: Marijuana    Social History   Socioeconomic History   Marital status: Single    Spouse name: Not on file   Number of children: Not on file   Years of education: Not on file   Highest education level: Not on file  Occupational History   Not on file  Tobacco Use   Smoking status: Some Days    Types: Cigarettes   Smokeless tobacco: Never  Vaping Use   Vaping status: Never Used  Substance and Sexual Activity   Alcohol use: Not Currently   Drug use: Yes    Types: Marijuana   Sexual activity: Yes    Birth control/protection: Implant  Other Topics Concern   Not on file  Social History Narrative   Not on file   Social Determinants of  Health   Financial Resource Strain: Not on file  Food Insecurity: No Food Insecurity (02/24/2023)   Hunger Vital Sign    Worried About Running Out of Food in the Last Year: Never true    Ran Out of Food in the Last Year: Never true  Transportation Needs: No Transportation Needs (02/24/2023)   PRAPARE - Administrator, Civil Service (Medical): No    Lack of Transportation (Non-Medical): No  Physical Activity: Not on file  Stress: Not on file  Social Connections: Not on file   Additional Social History:    Sleep: Good  Appetite:  Good  Current Medications: Current Facility-Administered Medications  Medication Dose Route Frequency Provider Last Rate Last Admin   acetaminophen (TYLENOL) tablet 650 mg  650 mg Oral Q6H PRN Onuoha, Chinwendu V, NP   650 mg at 03/02/23 0952   alum & mag hydroxide-simeth (MAALOX/MYLANTA) 200-200-20 MG/5ML suspension 30 mL  30 mL Oral Q4H PRN Onuoha, Chinwendu V, NP       ARIPiprazole (ABILIFY) tablet 10 mg  10 mg Oral Daily Ntuen, Tina C, FNP   10 mg at 03/02/23 0809   cephALEXin (KEFLEX) capsule 500 mg  500 mg Oral Q12H Nkwenti, Doris, NP   500 mg at 03/02/23 0809   cholecalciferol (VITAMIN D3) tablet 1,000 Units  1,000 Units Oral  QHS Cecilie Lowers, FNP   1,000 Units at 03/01/23 2131   diphenhydrAMINE (BENADRYL) capsule 50 mg  50 mg Oral TID PRN Onuoha, Chinwendu V, NP       Or   diphenhydrAMINE (BENADRYL) injection 50 mg  50 mg Intramuscular TID PRN Onuoha, Chinwendu V, NP       EPINEPHrine (EPI-PEN) injection 0.3 mg  0.3 mg Intramuscular Daily PRN Nkwenti, Doris, NP       feeding supplement (ENSURE ENLIVE / ENSURE PLUS) liquid 237 mL  237 mL Oral BID BM Attiah, Nadir, MD   237 mL at 03/02/23 1032   haloperidol (HALDOL) tablet 5 mg  5 mg Oral TID PRN Onuoha, Chinwendu V, NP       Or   haloperidol lactate (HALDOL) injection 5 mg  5 mg Intramuscular TID PRN Onuoha, Chinwendu V, NP       hydrOXYzine (ATARAX) tablet 25 mg  25 mg Oral TID PRN  Onuoha, Chinwendu V, NP   25 mg at 03/01/23 2130   LORazepam (ATIVAN) tablet 2 mg  2 mg Oral TID PRN Onuoha, Chinwendu V, NP       Or   LORazepam (ATIVAN) injection 2 mg  2 mg Intramuscular TID PRN Onuoha, Chinwendu V, NP       magnesium hydroxide (MILK OF MAGNESIA) suspension 30 mL  30 mL Oral Daily PRN Onuoha, Chinwendu V, NP   30 mL at 02/25/23 1019   nicotine (NICODERM CQ - dosed in mg/24 hours) patch 21 mg  21 mg Transdermal Daily Nkwenti, Doris, NP   21 mg at 02/25/23 0756   nicotine polacrilex (NICORETTE) gum 2 mg  2 mg Oral PRN Starleen Blue, NP       sertraline (ZOLOFT) tablet 50 mg  50 mg Oral Daily Nkwenti, Doris, NP   50 mg at 03/02/23 0809   traZODone (DESYREL) tablet 50 mg  50 mg Oral QHS PRN Onuoha, Chinwendu V, NP   50 mg at 03/01/23 2131   Lab Results:  No results found for this or any previous visit (from the past 48 hour(s)).  Blood Alcohol level:  Lab Results  Component Value Date   ETH <10 02/23/2023   ETH 221 (H) 09/18/2022   Metabolic Disorder Labs: Lab Results  Component Value Date   HGBA1C 5.4 02/25/2023   MPG 108.28 02/25/2023   No results found for: "PROLACTIN" Lab Results  Component Value Date   CHOL 150 02/25/2023   TRIG 84 02/25/2023   HDL 50 02/25/2023   CHOLHDL 3.0 02/25/2023   VLDL 17 02/25/2023   LDLCALC 83 02/25/2023   Physical Findings: AIMS: Facial and Oral Movements Muscles of Facial Expression: None, normal Lips and Perioral Area: None, normal Jaw: None, normal Tongue: None, normal,Extremity Movements Upper (arms, wrists, hands, fingers): None, normal Lower (legs, knees, ankles, toes): None, normal, Trunk Movements Neck, shoulders, hips: None, normal, Overall Severity Severity of abnormal movements (highest score from questions above): None, normal Incapacitation due to abnormal movements: None, normal Patient's awareness of abnormal movements (rate only patient's report): No Awareness, Dental Status Current problems with teeth  and/or dentures?: No Does patient usually wear dentures?: No  CIWA:    COWS:     Musculoskeletal: Strength & Muscle Tone: within normal limits Gait & Station: normal Patient leans: N/A  Psychiatric Specialty Exam:  Presentation  General Appearance:  Appropriate for Environment  Eye Contact: Fair  Speech: Normal Rate  Speech Volume: Normal  Handedness: Right  Mood and Affect  Mood: Euthymic  Affect: Appropriate; Full Range  Thought Process  Thought Processes: Coherent  Descriptions of Associations:Intact  Orientation:Full (Time, Place and Person)  Thought Content:Logical  History of Schizophrenia/Schizoaffective disorder:Yes  Duration of Psychotic Symptoms:Greater than six months  Hallucinations:Hallucinations: None Description of Auditory Hallucinations: Denies  Ideas of Reference:None  Suicidal Thoughts:Suicidal Thoughts: No  Homicidal Thoughts:Homicidal Thoughts: No  Sensorium  Memory: Immediate Fair; Recent Fair; Remote Fair  Judgment: Fair  Insight: Good  Executive Functions  Concentration: Fair  Attention Span: Good  Recall: Good  Fund of Knowledge: Good  Language: Good  Psychomotor Activity  Psychomotor Activity: Psychomotor Activity: Normal  Assets  Assets: Communication Skills; Desire for Improvement; Social Support; Housing  Sleep  Sleep: Sleep: Good Number of Hours of Sleep: 5.5  Physical Exam: Physical Exam Vitals and nursing note reviewed.  HENT:     Head: Normocephalic.     Nose: Nose normal.     Mouth/Throat:     Mouth: Mucous membranes are moist.  Eyes:     Extraocular Movements: Extraocular movements intact.  Cardiovascular:     Rate and Rhythm: Normal rate.     Pulses: Normal pulses.  Pulmonary:     Effort: Pulmonary effort is normal.  Abdominal:     Comments: Deferred  Genitourinary:    Comments: Deferred Musculoskeletal:        General: Normal range of motion.     Cervical back:  Normal range of motion.  Skin:    General: Skin is warm.  Neurological:     Mental Status: She is alert and oriented to person, place, and time.  Psychiatric:        Mood and Affect: Mood normal.        Behavior: Behavior normal.        Thought Content: Thought content normal.    Review of Systems  Constitutional:  Negative for chills and fever.  HENT:  Negative for sore throat.   Eyes:  Negative for blurred vision.  Respiratory:  Negative for cough, shortness of breath and wheezing.   Cardiovascular:  Negative for chest pain and palpitations.  Gastrointestinal:  Negative for abdominal pain, heartburn, nausea and vomiting.  Genitourinary:  Negative for dysuria, frequency and urgency.  Musculoskeletal: Negative.   Skin:  Negative for rash.  Neurological:  Negative for dizziness, tingling, tremors, sensory change and headaches.  Endo/Heme/Allergies:        See allergy listing  Psychiatric/Behavioral:  The patient is nervous/anxious.    Blood pressure 134/78, pulse 67, temperature 98.6 F (37 C), temperature source Oral, resp. rate 16, height 5\' 5"  (1.651 m), weight 56.2 kg, SpO2 100%. Body mass index is 20.63 kg/m.  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   EKG with QTc 410   Other PRNS -Continue Tylenol 650 mg every 6 hours PRN for mild pain -Continue Maalox 30 mg every 4 hrs PRN for indigestion -Continue Milk of Magnesia as needed every 6 hrs for constipation   Discharge Planning: Social work and case management to assist with discharge planning and identification of hospital follow-up needs prior to discharge Estimated LOS: The plan is to discharge her on Friday, 03/02/2023. Discharge Concerns: Need to establish a safety plan; Medication compliance and effectiveness Discharge Goals: Return home with outpatient referrals for mental health follow-up including medication management/psychotherapy   I certify that inpatient services furnished can reasonably be expected to improve the patient's condition.      Rex Kras, MD 03/02/2023, 10:56 AM Patient ID: Courtney Ashley, female   DOB: [birth year], 22 y.o.   MRN: [id number]

## 2023-03-02 NOTE — Group Note (Signed)
LCSW Group Therapy Note   Group Date: 03/02/2023 Start Time: 1100 End Time: 1200   Type of Therapy and Topic: Group Therapy: Relationship Check-Ins   Participation Level: Did Not Attend   Description Group:  In this group patients are encouraged to rate how well or not well they are able to improve their relationships in Beliefs and Values, Communication, Family and Friends, Actuary and Household, and Intimacy. Patients will be able to discuss and identify what is going well within these aspects and what is not. Patients will be able to find appropriate ways, solutions, and skills that will help them within the selected relationships category. Patients will be encouraged to share and reflect on why things within their relationships are not going so well and get feedback from the instructor or their peers.  This group will be solution focused and process-oriented with patients' participation in sharing and listening to their own and peers experience; along with receive support and advice on how to improve or change the circumstance that is known to be challenging in their relationships category (Beliefs and Values, Communication, Family and Friends, Actuary and Household, and Intimacy).   Therapeutic Goals:  Patient will identify their strengths and weakness within their Beliefs and Values, Communication, Family and Friends, Actuary and Household, and Intimacy relationships. Patient will identify reasons why and how they can improve their relationships.  Patient will identify how they can be supportive and honest to themselves and in their relationships.  Patient will be able to gain support and give support to others with similar challenges.   Summary of Patient Progress  Did not attend group  Therapeutic Modalities:  Solution Focused Therapy Cognitive Behavioral Therapy  Psychodynamic Therapy  Dialectical Behavior Therapy   Izell Kappa, LCSW 03/02/2023  12:48 PM

## 2023-03-02 NOTE — Group Note (Signed)
Date:  03/02/2023 Time:  10:52 AM  Group Topic/Focus:  Goals Group:   The focus of this group is to help patients establish daily goals to achieve during treatment and discuss how the patient can incorporate goal setting into their daily lives to aide in recovery.    Participation Level:  Active  Participation Quality:  Appropriate  Affect:  Appropriate  Cognitive:  Appropriate  Insight: Appropriate  Engagement in Group:  Engaged  Modes of Intervention:  Discussion  Additional Comments:     Reymundo Poll 03/02/2023, 10:52 AM

## 2023-03-02 NOTE — Progress Notes (Signed)
   03/02/23 0900  Psych Admission Type (Psych Patients Only)  Admission Status Voluntary  Psychosocial Assessment  Patient Complaints None  Eye Contact Fair  Facial Expression Animated  Affect Appropriate to circumstance  Speech Logical/coherent  Interaction Assertive  Motor Activity Slow  Appearance/Hygiene Unremarkable  Behavior Characteristics Cooperative;Appropriate to situation  Mood Pleasant  Thought Process  Coherency WDL  Content WDL  Delusions None reported or observed  Perception WDL  Hallucination None reported or observed  Judgment Limited  Confusion None  Danger to Self  Current suicidal ideation? Denies  Agreement Not to Harm Self Yes  Description of Agreement verbal  Danger to Others  Danger to Others None reported or observed

## 2023-03-02 NOTE — Progress Notes (Signed)
   03/02/23 0100  Psych Admission Type (Psych Patients Only)  Admission Status Voluntary  Psychosocial Assessment  Patient Complaints None  Eye Contact Fair  Facial Expression Animated  Affect Appropriate to circumstance  Speech Logical/coherent  Interaction Assertive  Motor Activity Slow  Appearance/Hygiene Unremarkable  Behavior Characteristics Cooperative;Appropriate to situation  Mood Pleasant  Thought Process  Coherency WDL  Content Preoccupation  Delusions Paranoid  Perception WDL  Hallucination None reported or observed  Judgment Impaired  Confusion None  Danger to Self  Current suicidal ideation? Denies  Agreement Not to Harm Self Yes  Description of Agreement verbal  Danger to Others  Danger to Others None reported or observed

## 2023-03-02 NOTE — Progress Notes (Signed)
  [facility] Adult Case Management Discharge Plan :  Will you be returning to the same living situation after discharge:  Yes,  Pt will reside with Grandmother -[name] [phone] At discharge, do you have transportation home?: Yes,  -[name] [phone]([id number] Do you have the ability to pay for your medications: Yes,  Insured  Release of information consent forms completed and in the chart;  Patient's signature needed at discharge.  Patient to Follow up at:  Follow-up Information     [facility]. Go to.   Specialty: Behavioral Health Why: Please go to this provider for an assessment, to obtain therapy and medication management services.  For fastest service, please go Monday through Friday, arrive by 7:00 am for same day service. Contact information: [phone] [place] [address] [phone]                Next level of care provider has access to [facility] Link:yes  Safety Planning and Suicide Prevention discussed: Yes,  -[name] [phone]     Has patient been referred to the Quitline?: Patient refused referral for treatment  Patient has been referred for addiction treatment: Yes, referral information given but appointment not made [facility] (list facility). Patient to continue working towards treatment goals after discharge. Patient no longer meets criteria for inpatient criteria per attending physician. Continue taking medications as prescribed, nursing to provide instructions at discharge. Follow up with all scheduled appointments.   Lisanne Ponce S Koa Zoeller, LCSW 03/02/2023, 3:07PM

## 2023-03-02 NOTE — Progress Notes (Signed)
   03/02/23 2120  Psych Admission Type (Psych Patients Only)  Admission Status Voluntary  Psychosocial Assessment  Patient Complaints Sleep disturbance (poor sleep)  Eye Contact Fair  Facial Expression Flat  Affect Appropriate to circumstance  Speech Logical/coherent  Interaction Assertive  Motor Activity Other (Comment) (wnl)  Appearance/Hygiene Unremarkable  Behavior Characteristics Cooperative;Appropriate to situation  Mood Pleasant  Thought Process  Coherency WDL  Content WDL  Delusions None reported or observed  Perception WDL  Hallucination None reported or observed  Judgment WDL  Confusion None  Danger to Self  Current suicidal ideation? Denies  Agreement Not to Harm Self Yes  Description of Agreement verbal  Danger to Others  Danger to Others None reported or observed   Progress note   D: Pt seen in her room laying in bed. Pt denies SI, HI, AVH. Pt rates pain  0/10. Pt rates anxiety  0/10 and depression  0/10. Pt states that one good thing about her day is that she is being discharged tomorrow. She will stay with her grandmother. Pt endorses good appetite, group attendance and fair sleep. No other concerns noted at this time.  A: Pt provided support and encouragement. Pt given scheduled medication as prescribed. PRNs as appropriate. Q15 min checks for safety.   R: Pt safe on the unit. Will continue to monitor.

## 2023-03-02 NOTE — Plan of Care (Signed)
Problem: Education: Goal: Emotional status will improve Outcome: Progressing Goal: Mental status will improve Outcome: Progressing Goal: Verbalization of understanding the information provided will improve Outcome: Progressing   Problem: Activity: Goal: Interest or engagement in activities will improve Outcome: Progressing

## 2023-03-02 NOTE — [facility] Group Notes (Signed)
Adult Psychoeducational Group Note  Date:  03/02/2023 Time:  9:05 PM  Group Topic/Focus:  Wrap-Up Group:   The focus of this group is to help patients review their daily goal of treatment and discuss progress on daily workbooks.  Participation Level:  Active  Participation Quality:  Appropriate  Affect:  Appropriate  Cognitive:  Appropriate  Insight: Appropriate  Engagement in Group:  Engaged  Modes of Intervention:  Discussion  Additional Comments:  Patient attended and participated in the Wrap-up group.  Jearl Klinefelter 03/02/2023, 9:05 PM

## 2023-03-03 DIAGNOSIS — F3181 Bipolar II disorder: Secondary | ICD-10-CM | POA: Diagnosis not present

## 2023-03-03 MED ORDER — ARIPIPRAZOLE 10 MG PO TABS: 10.0000 mg | ORAL_TABLET | Freq: Every day | ORAL | 0 refills | Status: DC

## 2023-03-03 MED ORDER — NICOTINE POLACRILEX 2 MG MT GUM: 2.0000 mg | CHEWING_GUM | OROMUCOSAL | 0 refills | Status: DC | PRN

## 2023-03-03 MED ORDER — NICOTINE 21 MG/24HR TD PT24: 21.0000 mg | MEDICATED_PATCH | Freq: Every day | TRANSDERMAL | 0 refills | Status: DC

## 2023-03-03 MED ORDER — SERTRALINE HCL 50 MG PO TABS: 50.0000 mg | ORAL_TABLET | Freq: Every day | ORAL | 0 refills | Status: DC

## 2023-03-03 NOTE — Progress Notes (Signed)
   03/03/23 0800  Psych Admission Type (Psych Patients Only)  Admission Status Voluntary  Psychosocial Assessment  Patient Complaints None  Eye Contact Fair  Facial Expression Animated  Affect Appropriate to circumstance  Speech Logical/coherent  Interaction Assertive  Motor Activity Other (Comment) (wnl)  Appearance/Hygiene Unremarkable  Behavior Characteristics Cooperative;Appropriate to situation  Mood Pleasant  Thought Process  Coherency WDL  Content WDL  Delusions None reported or observed  Perception WDL  Hallucination None reported or observed  Judgment WDL  Confusion None  Danger to Self  Current suicidal ideation? Denies  Agreement Not to Harm Self Yes  Description of Agreement verbal  Danger to Others  Danger to Others None reported or observed

## 2023-03-03 NOTE — Plan of Care (Signed)
  Problem: Education: Goal: Emotional status will improve Outcome: Progressing Goal: Mental status will improve Outcome: Progressing Goal: Verbalization of understanding the information provided will improve Outcome: Progressing   Problem: Activity: Goal: Interest or engagement in activities will improve Outcome: Progressing Goal: Sleeping patterns will improve Outcome: Progressing   Problem: Coping: Goal: Ability to verbalize frustrations and anger appropriately will improve Outcome: Progressing Goal: Ability to demonstrate self-control will improve Outcome: Progressing   Problem: Physical Regulation: Goal: Ability to maintain clinical measurements within normal limits will improve Outcome: Progressing   Problem: Safety: Goal: Periods of time without injury will increase Outcome: Progressing   Problem: Education: Goal: Will be free of psychotic symptoms Outcome: Progressing Goal: Knowledge of the prescribed therapeutic regimen will improve Outcome: Progressing   Problem: Coping: Goal: Will verbalize feelings Outcome: Progressing

## 2023-03-03 NOTE — [facility] Suicide Risk Assessment (Signed)
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, mood-congruent (HCC) Active Problems:   Insomnia   GAD (generalized anxiety disorder)   Delta-9-tetrahydrocannabinol (THC) dependence (HCC)   Tobacco dependence   Total Time spent with patient: 45 minutes  Musculoskeletal: Strength & Muscle Tone: within normal limits Gait & Station: normal Patient leans: N/A  Psychiatric Specialty Exam  Presentation  General Appearance:  Appropriate for Environment  Eye Contact: Fair  Speech: Normal Rate  Speech Volume: Normal  Handedness: Right   Mood and Affect  Mood: Euthymic  Duration of Depression Symptoms: Greater than two weeks  Affect: Appropriate; Full Range   Thought Process  Thought Processes: Coherent  Descriptions of Associations:Intact  Orientation:Full (Time, Place and Person)  Thought Content:Logical  History of Schizophrenia/Schizoaffective disorder:Yes  Duration of Psychotic Symptoms:Greater than six months  Hallucinations:Hallucinations: None  Ideas of Reference:None  Suicidal Thoughts:Suicidal Thoughts: No  Homicidal Thoughts:Homicidal Thoughts: No   Sensorium  Memory: Immediate Fair; Recent Fair; Remote Fair  Judgment: Fair  Insight: Good   Executive Functions  Concentration: Fair  Attention Span: Good  Recall: Good  Fund of Knowledge: Good  Language: Good   Psychomotor Activity  Psychomotor Activity: Psychomotor Activity: Normal   Assets  Assets: Communication Skills; Desire for Improvement; Social Support; Housing   Sleep  Sleep: Sleep: Good Number of Hours of Sleep: 5.5   Physical Exam: Physical Exam Constitutional:      Appearance: Normal  appearance. She is normal weight.  Neurological:     Mental Status: She is alert.  Psychiatric:        Mood and Affect: Mood normal.        Behavior: Behavior normal.    ROS Blood pressure 130/71, pulse 69, temperature 98.7 F (37.1 C), temperature source Oral, resp. rate 16, height 5\' 5"  (1.651 m), weight 56.2 kg, SpO2 100%. Body mass index is 20.63 kg/m.  Mental Status Per Nursing Assessment::   On Admission:  NA  Demographic Factors:  Unemployed  Loss Factors: NA  Historical Factors: NA  Risk Reduction Factors:   Living with another person, especially a relative and Positive social support  Continued Clinical Symptoms:  Bipolar Disorder:   Bipolar II  Cognitive Features That Contribute To Risk:  None    Suicide Risk:  Mild:  Suicidal ideation of limited frequency, intensity, duration, and specificity.  There are no identifiable plans, no associated intent, mild dysphoria and related symptoms, good self-control (both objective and subjective assessment), few other risk factors, and identifiable protective factors, including available and accessible social support.   Follow-up Information     [facility]. Go to.   Specialty: Behavioral Health Why: Please go to this provider for an assessment, to obtain therapy and medication management services.  For fastest service, please go Monday through Friday, arrive by 7:00 am for same day service. Contact information: [address] [place] [address] [phone]                Plan Of Care/Follow-up recommendations:  Activity:  As tolerated  Rex Kras, MD 03/03/2023, 9:30 AM

## 2023-03-03 NOTE — Progress Notes (Signed)
Discharge Note:  Patient denies SI/HI/AVH at this time. Discharge instructions, AVS, prescriptions, and transition record gone over with patient. Patient agrees to comply with medication management, follow-up visit, and outpatient therapy. Patient belongings returned to patient. Patient questions and concerns addressed and answered. Patient ambulatory off unit. Patient discharged to home with mom.

## 2023-03-03 NOTE — Progress Notes (Signed)
   03/03/23 0557  15 Minute Checks  Location Bedroom  Visual Appearance Calm  Behavior Sleeping  Sleep (Behavioral Health Patients Only)  Calculate sleep? (Click Yes once per 24 hr at 0600 safety check) Yes  Documented sleep last 24 hours 7.75

## 2023-03-03 NOTE — Discharge Summary (Signed)
Physician Discharge Summary Note  Patient:  Courtney Ashley is an 22 y.o., female MRN:  [id number] DOB:  03/29/2001 Patient phone:  [phone] (home)  Patient address:   [address] [place] [address],  Total Time spent with patient: 45 minutes  Date of Admission:  02/24/2023 Date of Discharge: 03/03/2023  Reason for Admission:   Courtney Ashley is a 22 year old African-American female with a h/o THC dependence who presented to the [facility] ER initially on 9/19 with complaints of chest pain as well as shortness of breath.  She was worked up medically and cleared prior to being discharged, but was taken to the [facility] health urgent care by her aunt on the same day with complaints of auditory and visual hallucinations of a deceased cousin & aunt.  Patient was eventually transferred voluntarily to this hospital for treatment and stabilization of her mental status.   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  Patient does have a history of postpartum depression, bipolar disorder type I with psychosis.  History of noncompliance.  Childhood history of ADHD.  Past Medical History:  Past Medical History:  Diagnosis Date   ADHD (attention deficit hyperactivity disorder)    childhood   Anemia    Nexplanon in place 2018    Past Surgical History:  Procedure Laterality Date   TONSILLECTOMY AND ADENOIDECTOMY     Family History:  Family History  Problem Relation Age of Onset   Diabetes Maternal Grandmother    Family Psychiatric  History: See  H&P Social History:  Social History   Substance and Sexual Activity  Alcohol Use Not Currently     Social History   Substance and Sexual Activity  Drug Use Yes   Types: Marijuana    Social History   Socioeconomic History   Marital status: Single    Spouse name: Not on file   Number of children: Not on file   Years of education: Not on file   Highest education level: Not on file  Occupational History   Not on file  Tobacco Use   Smoking status: Some Days    Types: Cigarettes   Smokeless tobacco: Never  Vaping Use   Vaping status: Never Used  Substance and Sexual Activity   Alcohol use: Not Currently   Drug use: Yes    Types: Marijuana   Sexual activity: Yes    Birth control/protection: Implant  Other Topics Concern   Not on file  Social History Narrative   Not on file   Social Determinants of Health   Financial Resource Strain: Not on file  Food Insecurity: No Food Insecurity (02/24/2023)   Hunger Vital Sign    Worried About Running Out of Food in the Last Year: Never true    Ran Out of Food in the Last Year: Never true  Transportation Needs: No Transportation Needs (02/24/2023)   PRAPARE - Administrator, Civil Service (Medical): No    Lack of Transportation (Non-Medical): No  Physical Activity: Not on file  Stress: Not on file  Social Connections: Not on file    Hospital Course:  During the patient's hospitalization, patient  had extensive initial psychiatric evaluation, and follow-up psychiatric evaluations every day.  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  Patient's psychiatric medications were adjusted on admission: Patient was placed on Abilify 10 mg a day and Zoloft 50 mg a day.  During the hospitalization, other adjustments were made to the patient's psychiatric medication regimen: Abilify was titrated to a therapeutic dose, she responded to 10 mg a day.  Patient's care  was discussed during the interdisciplinary team meeting every day during the hospitalization.  The patient denied  having side effects to prescribed psychiatric medication.  Gradually, patient started adjusting to milieu. The patient was evaluated each day by a clinical provider to ascertain response to treatment. Improvement was noted by the patient's report of decreasing symptoms, improved sleep and appetite, affect, medication tolerance, behavior, and participation in unit programming.  Patient was asked each day to complete a self inventory noting mood, mental status, pain, new symptoms, anxiety and concerns.    Symptoms were reported as significantly decreased or resolved completely by discharge.   On day of discharge, the patient reports that their mood is stable. The patient denied having suicidal thoughts for more than 48 hours prior to discharge.  Patient denies having homicidal thoughts.  Patient denies having auditory hallucinations.  Patient denies any visual hallucinations or other symptoms of psychosis. The patient was motivated to continue taking medication with a goal of continued improvement in mental health.   The patient reports their target psychiatric symptoms of psychosis responded well to the psychiatric medications, and the patient reports overall benefit other psychiatric hospitalization. Supportive psychotherapy was provided to the patient. The patient also participated in regular group therapy while hospitalized. Coping skills, problem solving as well as relaxation therapies were also part of the unit programming.  Labs were reviewed with the patient, and abnormal results were discussed with the patient.  The patient is able to verbalize their individual safety plan to this provider.  # It is recommended to the patient to continue psychiatric medications as prescribed, after discharge from the hospital.    # It is recommended to the patient to follow up with your  outpatient psychiatric provider and PCP.  # It was discussed with the patient, the impact of alcohol, drugs, tobacco have been there overall psychiatric and medical wellbeing, and total abstinence from substance use was recommended the patient.ed.  # Prescriptions provided or sent directly to preferred pharmacy at discharge. Patient agreeable to plan. Given opportunity to ask questions. Appears to feel comfortable with discharge.    # In the event of worsening symptoms, the patient is instructed to call the crisis hotline, 911 and or go to the nearest ED for appropriate evaluation and treatment of symptoms. To follow-up with primary care provider for other medical issues, concerns and or health care needs  # Patient was discharged home to grandmother with a plan to follow up as noted below.   Physical Findings: AIMS: Facial and Oral Movements Muscles of Facial Expression: None, normal Lips and Perioral Area: None, normal Jaw: None, normal Tongue: None, normal,Extremity Movements Upper (arms, wrists, hands, fingers): None, normal Lower (legs, knees, ankles, toes): None, normal, Trunk Movements Neck, shoulders, hips: None, normal, Overall Severity Severity of abnormal movements (highest score from questions above): None, normal Incapacitation due to abnormal movements: None, normal Patient's awareness of abnormal movements (rate only patient's report): No Awareness, Dental Status Current problems with teeth and/or dentures?: No Does patient usually wear dentures?:  No  CIWA:    COWS:     Musculoskeletal: Strength & Muscle Tone: within normal limits Gait & Station: normal Patient leans: N/A   Psychiatric Specialty Exam:  Presentation  General Appearance:  Appropriate for Environment  Eye Contact: Fair  Speech: Normal Rate  Speech Volume: Normal  Handedness: Right   Mood and Affect  Mood: Euthymic  Affect: Appropriate   Thought Process  Thought  Processes: Linear  Descriptions of Associations:Intact  Orientation:Full (Time, Place and Person)  Thought Content:Logical  History of Schizophrenia/Schizoaffective disorder:No  Duration of Psychotic Symptoms:N/A  Hallucinations:Hallucinations: None  Ideas of Reference:None  Suicidal Thoughts:Suicidal Thoughts: No  Homicidal Thoughts:Homicidal Thoughts: No   Sensorium  Memory: Immediate Fair; Recent Fair; Remote Fair  Judgment: Fair  Insight: Fair   Art therapist  Concentration: Fair  Attention Span: Fair  Recall: [employer] of Knowledge: Fair  Language: Fair   Psychomotor Activity  Psychomotor Activity: Psychomotor Activity: Normal   Assets  Assets: Communication Skills; Desire for Improvement; Housing; Social Support   Sleep  Sleep: Sleep: Good Number of Hours of Sleep: 5.5    Physical Exam: Physical Exam Constitutional:      Appearance: Normal appearance.  Neurological:     General: No focal deficit present.     Mental Status: She is alert and oriented to person, place, and time. Mental status is at baseline.  Psychiatric:        Mood and Affect: Mood normal.        Behavior: Behavior normal.    Review of Systems  Psychiatric/Behavioral: Negative.    All other systems reviewed and are negative.  Blood pressure 130/71, pulse 69, temperature 98.7 F (37.1 C), temperature source Oral, resp. rate 16, height 5\' 5"  (1.651 m), weight 56.2 kg, SpO2 100%. Body mass index is 20.63 kg/m.   Social History   Tobacco Use  Smoking Status Some Days   Types: Cigarettes  Smokeless Tobacco Never   Tobacco Cessation:  A prescription for an FDA-approved tobacco cessation medication provided at discharge   Blood Alcohol level:  Lab Results  Component Value Date   ETH <10 02/23/2023   ETH 221 (H) 09/18/2022    Metabolic Disorder Labs:  Lab Results  Component Value Date   HGBA1C 5.4 02/25/2023   MPG 108.28 02/25/2023   No  results found for: "PROLACTIN" Lab Results  Component Value Date   CHOL 150 02/25/2023   TRIG 84 02/25/2023   HDL 50 02/25/2023   CHOLHDL 3.0 02/25/2023   VLDL 17 02/25/2023   LDLCALC 83 02/25/2023    See Psychiatric Specialty Exam and Suicide Risk Assessment completed by Attending Physician prior to discharge.  Discharge destination:  Home  Is patient on multiple antipsychotic therapies at discharge:  No   Has Patient had three or more failed trials of antipsychotic monotherapy by history:  No  Recommended Plan for Multiple Antipsychotic Therapies: NA  Discharge Instructions     Diet - low sodium heart healthy   Complete by: As directed    Increase activity slowly   Complete by: As directed       Allergies as of 03/03/2023       Reactions   Fish Allergy Itching, Swelling        Medication List     TAKE these medications      Indication  ARIPiprazole 10 MG tablet Commonly known as: ABILIFY Take 1 tablet (10 mg total) by mouth daily. Start taking on: March 04, 2023  Indication: MIXED BIPOLAR AFFECTIVE DISORDER, mood stabilization   nicotine 21 mg/24hr patch Commonly known as: NICODERM CQ - dosed in mg/24 hours Place 1 patch (21 mg total) onto the skin daily. Start taking on: March 04, 2023  Indication: Nicotine Addiction   nicotine polacrilex 2 MG gum Commonly known as: NICORETTE Take 1 each (2 mg total) by mouth as needed for smoking cessation.  Indication: Nicotine Addiction   sertraline 50 MG tablet Commonly known as: ZOLOFT Take 1 tablet (50 mg total) by mouth daily. Start taking on: March 04, 2023  Indication: Major Depressive Disorder        Follow-up Information     [facility]. Go to.   Specialty: Behavioral Health Why: Please go to this provider for an assessment, to obtain therapy and medication management services.  For fastest service, please go Monday through Friday, arrive by 7:00 am for  same day service. Contact information: [address] [place] [address] [phone]                Follow-up recommendations:  Activity:  As tolerated  Comments: The patient was seen and evaluated prior to discharge.  She is alert and oriented cooperative and pleasant and reports no side effects or symptoms at this time.  She is contracting for safety.  She reports that she plans to go back to her grandmother's house and continue with outpatient follow-up as recommended with behavioral health.  She has a safety plan in place.  She was no longer disorganized or delusional and denied any paranoid thoughts.  She was sleeping better.  Prognosis is guarded and she is strongly encouraged to comply with recommended treatment.  She plans to go back to work.  Signed: Rex Kras, MD 03/03/2023, 9:42 AM

## 2023-03-31 ENCOUNTER — Ambulatory Visit (HOSPITAL_COMMUNITY): Payer: Medicaid Other | Admitting: Student

## 2023-04-07 ENCOUNTER — Ambulatory Visit (INDEPENDENT_AMBULATORY_CARE_PROVIDER_SITE_OTHER): Payer: MEDICAID | Admitting: Student

## 2023-04-07 DIAGNOSIS — F3181 Bipolar II disorder: Secondary | ICD-10-CM | POA: Diagnosis not present

## 2023-04-07 DIAGNOSIS — F411 Generalized anxiety disorder: Secondary | ICD-10-CM

## 2023-04-07 DIAGNOSIS — F1210 Cannabis abuse, uncomplicated: Secondary | ICD-10-CM

## 2023-04-07 MED ORDER — SERTRALINE HCL 100 MG PO TABS: 100.0000 mg | ORAL_TABLET | Freq: Every day | ORAL | 2 refills | Status: DC

## 2023-04-07 MED ORDER — ARIPIPRAZOLE 10 MG PO TABS: 10.0000 mg | ORAL_TABLET | Freq: Every day | ORAL | 2 refills | Status: DC
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
## 2023-04-11 DIAGNOSIS — F1210 Cannabis abuse, uncomplicated: Secondary | ICD-10-CM | POA: Insufficient documentation

## 2023-04-12 ENCOUNTER — Encounter: Payer: Self-pay | Admitting: Cardiology

## 2023-04-12 ENCOUNTER — Ambulatory Visit: Payer: MEDICAID | Attending: Cardiology | Admitting: Cardiology

## 2023-04-12 VITALS — BP 112/62 | HR 62 | Resp 16 | Ht 65.0 in | Wt 143.4 lb

## 2023-04-12 DIAGNOSIS — F17200 Nicotine dependence, unspecified, uncomplicated: Secondary | ICD-10-CM

## 2023-04-12 DIAGNOSIS — R7989 Other specified abnormal findings of blood chemistry: Secondary | ICD-10-CM | POA: Diagnosis not present

## 2023-04-12 DIAGNOSIS — R0609 Other forms of dyspnea: Secondary | ICD-10-CM | POA: Diagnosis not present

## 2023-04-12 DIAGNOSIS — R072 Precordial pain: Secondary | ICD-10-CM

## 2023-04-12 DIAGNOSIS — F3181 Bipolar II disorder: Secondary | ICD-10-CM

## 2023-04-12 DIAGNOSIS — F1210 Cannabis abuse, uncomplicated: Secondary | ICD-10-CM

## 2023-04-12 DIAGNOSIS — F419 Anxiety disorder, unspecified: Secondary | ICD-10-CM

## 2023-04-12 NOTE — Patient Instructions (Signed)
Medication Instructions:  Your physician recommends that you continue on your current medications as directed. Please refer to the Current Medication list given to you today.  *If you need a refill on your cardiac medications before your next appointment, please call your pharmacy*  Lab Work: CBC, ESR, CRP to be drawn TODAY  If you have labs (blood work) drawn today and your tests are completely normal, you will receive your results only by: MyChart Message (if you have MyChart) OR A paper copy in the mail If you have any lab test that is abnormal or we need to change your treatment, we will call you to review the results.  Testing/Procedures: Your physician has requested that you have an echocardiogram. Echocardiography is a painless test that uses sound waves to create images of your heart. It provides your doctor with information about the size and shape of your heart and how well your heart's chambers and valves are working. This procedure takes approximately one hour. There are no restrictions for this procedure. Please do NOT wear cologne, perfume, aftershave, or lotions (deodorant is allowed). Please arrive 15 minutes prior to your appointment time.  Please note: We ask at that you not bring children with you during ultrasound (echo/ vascular) testing. Due to room size and safety concerns, children are not allowed in the ultrasound rooms during exams. Our front office staff cannot provide observation of children in our lobby area while testing is being conducted. An adult accompanying a patient to their appointment will only be allowed in the ultrasound room at the discretion of the ultrasound technician under special circumstances. We apologize for any inconvenience.  Follow-Up: At [facility], you and your health needs are our priority.  As part of our continuing mission to provide you with exceptional heart care, we have created designated Provider Care Teams.  These Care Teams  include your primary Cardiologist (physician) and Advanced Practice Providers (APPs -  Physician Assistants and Nurse Practitioners) who all work together to provide you with the care you need, when you need it.  Your next appointment:   3 month(s)  The format for your next appointment:   In Person  Provider:   Tessa Lerner, DO {

## 2023-04-12 NOTE — Progress Notes (Signed)
Cardiology Office Note:    Date:  04/12/2023  NAME:  Courtney Ashley    MRN: [id number] DOB:  2001-03-24   PCP:  Patient, No Pcp Per  Former Cardiology Providers: None Primary Cardiologist:  Tessa Lerner, DO, [facility] (established care 04/12/2023) Electrophysiologist:  None   Referring MD: No ref. provider found  Reason of Consult: Evaluation for acute pericarditis  Chief Complaint  Patient presents with   pericarditis    New Patient (Initial Visit)    History of Present Illness:    Courtney Ashley is a 22 y.o. African-American female whose past medical history and cardiovascular risk factors includes: ADHD, bipolar 2 disorder, THC dependence, general anxiety disorder, cigarette smoking. She is being seen today for the evaluation of acute pericarditis at the request of ER provider.  Patient presents to the office for evaluation for possible acute pericarditis after recent ER visit in September 2024.  Patient states that she went to [facility] emergency room department in September 2024 for chest pain evaluation.  Her high sensitive troponin x 1 were negative, initial EKG does not illustrate myocardial injury pattern or ischemia.  X-ray and other diagnostic testing was not performed.  Patient states that one of the nurses informed her that she may have inflammation of her heart for which she needs to be evaluated.  However, she was admitted to the psychiatric facility and due to no recent cardiology appointments available she presents now in November 2024.  She is accompanied by her parents at today's office visit.  She provides verbal consent with having them present during today's encounter.  Shortness of breath: More noticeable with effort related activities. Also associated with wheezing. Last for a few minutes. Better with resting. Denies orthopnea, PND, lower extremity swelling.  Chest heaviness: More noticeable when she lays back. Chest pain is not brought on by effort related  activities. Does not improve with resting. Self-limited.  No structured exercise program or daily routine.  But enjoys walking.  Current Medications: Current Meds  Medication Sig   ARIPiprazole (ABILIFY) 10 MG tablet Take 1 tablet (10 mg total) by mouth daily.   sertraline (ZOLOFT) 100 MG tablet Take 1 tablet (100 mg total) by mouth daily.     Allergies:    Fish allergy   Past Medical History: Past Medical History:  Diagnosis Date   ADHD (attention deficit hyperactivity disorder)    childhood   Anemia    Bipolar 2 disorder (HCC)    Chest pain    Delta-9-tetrahydrocannabinol (THC) dependence (HCC)    GAD (generalized anxiety disorder)    Insomnia    Nexplanon in place 2018   Psychosis (HCC)    Tobacco dependence     Past Surgical History: Past Surgical History:  Procedure Laterality Date   TONSILLECTOMY AND ADENOIDECTOMY      Social History: Social History   Tobacco Use   Smoking status: Some Days    Types: Cigarettes   Smokeless tobacco: Never  Vaping Use   Vaping status: Never Used  Substance Use Topics   Alcohol use: Not Currently   Drug use: Yes    Types: Marijuana    Family History: Family History  Problem Relation Age of Onset   Diabetes Maternal Grandmother     ROS:   Review of Systems  Cardiovascular:  Positive for dyspnea on exertion. Negative for chest pain, claudication, irregular heartbeat, leg swelling, near-syncope, orthopnea, palpitations, paroxysmal nocturnal dyspnea and syncope.  Respiratory:  Positive for wheezing (with overexertion). Negative  for shortness of breath.   Hematologic/Lymphatic: Negative for bleeding problem.    EKGs/Labs/Other Studies Reviewed:   EKG Interpretation Date/Time:  Wednesday April 12 2023 08:12:16 EST Ventricular Rate:  62 PR Interval:  144 QRS Duration:  74 QT Interval:  400 QTC Calculation: 406 R Axis:   -9  Text Interpretation: Normal sinus rhythm When compared with ECG of 23-Feb-2023 09:41,  No significant change since last tracing Confirmed by Tessa Lerner [phone]) on 04/12/2023 8:18:03 AM     Labs:    Latest Ref Rng & Units 02/23/2023   10:05 AM 09/18/2022   11:01 PM 07/26/2022    3:30 PM  CBC  WBC 4.0 - 10.5 K/uL 6.6  9.4  7.7   Hemoglobin 12.0 - 15.0 g/dL 78.4  69.6  29.5   Hematocrit 36.0 - 46.0 % 41.5  36.6  38.9   Platelets 150 - 400 K/uL 279  209  222        Latest Ref Rng & Units 02/23/2023   10:05 AM 09/18/2022   11:01 PM 07/26/2022    3:30 PM  BMP  Glucose 70 - 99 mg/dL 89  98  88   BUN 6 - 20 mg/dL 14  8  6    Creatinine 0.44 - 1.00 mg/dL 2.84  1.32  4.40   Sodium 135 - 145 mmol/L 137  139  140   Potassium 3.5 - 5.1 mmol/L 3.7  3.6  3.3   Chloride 98 - 111 mmol/L 106  107  107   CO2 22 - 32 mmol/L 18  21  24    Calcium 8.9 - 10.3 mg/dL 9.4  8.8  9.1       Latest Ref Rng & Units 02/23/2023   10:05 AM 09/18/2022   11:01 PM 07/26/2022    3:30 PM  CMP  Glucose 70 - 99 mg/dL 89  98  88   BUN 6 - 20 mg/dL 14  8  6    Creatinine 0.44 - 1.00 mg/dL 1.02  7.25  3.66   Sodium 135 - 145 mmol/L 137  139  140   Potassium 3.5 - 5.1 mmol/L 3.7  3.6  3.3   Chloride 98 - 111 mmol/L 106  107  107   CO2 22 - 32 mmol/L 18  21  24    Calcium 8.9 - 10.3 mg/dL 9.4  8.8  9.1   Total Protein 6.5 - 8.1 g/dL 6.8  5.6  6.3   Total Bilirubin 0.3 - 1.2 mg/dL 0.8  0.7  0.5   Alkaline Phos 38 - 126 U/L 32  28  33   AST 15 - 41 U/L 15  18  14    ALT 0 - 44 U/L 13  8  10      Lab Results  Component Value Date   CHOL 150 02/25/2023   HDL 50 02/25/2023   LDLCALC 83 02/25/2023   TRIG 84 02/25/2023   CHOLHDL 3.0 02/25/2023   No results for input(s): "LIPOA" in the last 8760 hours. No components found for: "NTPROBNP" No results for input(s): "PROBNP" in the last 8760 hours. Recent Labs    02/25/23 0626  TSH 0.333*    Physical Exam:    Today's Vitals   04/12/23 0808  BP: 112/62  Pulse: 62  Resp: 16  SpO2: 97%  Weight: 143 lb 6.4 oz (65 kg)  Height: 5\' 5"  (1.651 m)    Body mass index is 23.86 kg/m. Wt Readings from Last 3 Encounters:  04/12/23 143 lb 6.4 oz (65 kg)  02/23/23 145 lb 8.1 oz (66 kg)  09/18/22 145 lb (65.8 kg)    Physical Exam  Constitutional: No distress.  hemodynamically stable  Neck: No JVD present.  Cardiovascular: Normal rate, regular rhythm, S1 normal and S2 normal. Exam reveals no gallop, no S3 and no S4.  No murmur heard. Pulmonary/Chest: Effort normal and breath sounds normal. No stridor. She has no wheezes. She has no rales.  Abdominal: Soft. Bowel sounds are normal. She exhibits no distension. There is no abdominal tenderness.  Musculoskeletal:        General: No edema.     Cervical back: Neck supple.  Neurological: She is alert and oriented to person, place, and time. She has intact cranial nerves (2-12).  Skin: Skin is warm.     Impression & Recommendation(s):  Impression:   ICD-10-CM   1. Precordial pain  R07.2 EKG 12-Lead    ECHOCARDIOGRAM COMPLETE    CBC    Sedimentation rate    C-reactive protein    2. Dyspnea on exertion  R06.09     3. Low TSH level  R79.89     4. Tobacco dependence  F17.200     5. Cannabis abuse  F12.10     6. Bipolar 2 disorder (HCC)  F31.81     7. Anxiety  F41.9        Recommendation(s):  Precordial pain Dyspnea on exertion Patient presents to the office for evaluation for pericarditis. Based on the symptoms low likelihood of acute pericarditis. EKG at today's office visit and also in the ED does not show any significant PR elevation in aVR or PR depressions in the limb leads.  In addition, no pericardial rub on physical examination.  She remains afebrile.  And no documentation of pericardial effusion thus far. Check ESR and CRP and CBC Echo will be ordered to evaluate for structural heart disease and left ventricular systolic function.  Low TSH level ER records noted a low TSH level-recommended that she follows up with PCP for evaluation for thyroid disease  Tobacco  dependence Cannabis abuse Reemphasized importance of complete cessation of marijuana and nicotine.  Bipolar 2 disorder (HCC) Anxiety Currently on Abilify and Zoloft. Patient was recently admitted into a psychiatric facility-according to the patient. Continue outpatient follow-up.  Plan of care discussed with both patient and her parents during today's encounter.  Their questions and concerns were addressed to their satisfaction.  Orders Placed:  Orders Placed This Encounter  Procedures   CBC   Sedimentation rate    Standing Status:   Future    Standing Expiration Date:   04/11/2024   C-reactive protein    Standing Status:   Future    Standing Expiration Date:   04/11/2024   EKG 12-Lead   ECHOCARDIOGRAM COMPLETE    Standing Status:   Future    Standing Expiration Date:   04/11/2024    Order Specific Question:   Where should this test be performed    Answer:   [facility])    Order Specific Question:   Does the patient weigh less than or greater than 250 lbs?    Answer:   Patient weighs less than 250 lbs    Order Specific Question:   Perflutren DEFINITY (image enhancing agent) should be administered unless hypersensitivity or allergy exist    Answer:   Administer Perflutren    Order Specific Question:   Reason for exam-Echo  Answer:   Chest Pain  R07.9    As part of medical decision making results of the ER documentation from September 2024, labs from February 23, 2023, EKG were reviewed independently at today's visit.   Final Medication List:   No orders of the defined types were placed in this encounter.   Medications Discontinued During This Encounter  Medication Reason   nicotine (NICODERM CQ - DOSED IN MG/24 HOURS) 21 mg/24hr patch Patient Preference   nicotine polacrilex (NICORETTE) 2 MG gum Patient Preference     Current Outpatient Medications:    ARIPiprazole (ABILIFY) 10 MG tablet, Take 1 tablet (10 mg total) by mouth daily., Disp: 30  tablet, Rfl: 2   sertraline (ZOLOFT) 100 MG tablet, Take 1 tablet (100 mg total) by mouth daily., Disp: 30 tablet, Rfl: 2  Consent:   N/A  Disposition:   3 months sooner if needed. Patient may be asked to follow-up sooner based on the results of the above-mentioned testing.  Her questions and concerns were addressed to her satisfaction. She voices understanding of the recommendations provided during this encounter.    Signed, Tessa Lerner, DO, [facility]  [facility]  [address] [place], [place] [address] 04/12/2023 7:10 PM

## 2023-04-13 LAB — CBC
Hematocrit: 40.1 % (ref 34.0–46.6)
Hemoglobin: 12.7 g/dL (ref 11.1–15.9)
MCH: 27.5 pg (ref 26.6–33.0)
MCHC: 31.7 g/dL (ref 31.5–35.7)
MCV: 87 fL (ref 79–97)
Platelets: 248 10*3/uL (ref 150–450)
RBC: 4.62 x10E6/uL (ref 3.77–5.28)
RDW: 13.7 % (ref 11.7–15.4)
WBC: 7.9 10*3/uL (ref 3.4–10.8)

## 2023-04-26 ENCOUNTER — Other Ambulatory Visit: Payer: Self-pay

## 2023-04-26 ENCOUNTER — Emergency Department (HOSPITAL_COMMUNITY): Admission: EM | Admit: 2023-04-26 | Discharge: 2023-04-26 | Discharge status: Against Medical Advice | Payer: MEDICAID

## 2023-05-09 ENCOUNTER — Ambulatory Visit (HOSPITAL_COMMUNITY): Payer: MEDICAID | Admitting: Student

## 2023-05-16 ENCOUNTER — Ambulatory Visit (HOSPITAL_COMMUNITY): Payer: MEDICAID | Attending: Cardiology

## 2023-05-22 ENCOUNTER — Encounter (HOSPITAL_COMMUNITY): Payer: Self-pay | Admitting: Cardiology

## 2023-06-09 ENCOUNTER — Encounter (HOSPITAL_COMMUNITY): Payer: Medicaid Other | Admitting: Student

## 2023-06-30 ENCOUNTER — Encounter (HOSPITAL_COMMUNITY): Payer: Self-pay | Admitting: Emergency Medicine

## 2023-06-30 ENCOUNTER — Ambulatory Visit (HOSPITAL_COMMUNITY)
Admission: EM | Admit: 2023-06-30 | Discharge: 2023-06-30 | Disposition: A | Discharge status: Home / Self Care | Payer: MEDICAID | Attending: Emergency Medicine | Admitting: Emergency Medicine

## 2023-06-30 DIAGNOSIS — K047 Periapical abscess without sinus: Secondary | ICD-10-CM | POA: Diagnosis not present

## 2023-06-30 DIAGNOSIS — K029 Dental caries, unspecified: Secondary | ICD-10-CM

## 2023-06-30 MED ORDER — KETOROLAC TROMETHAMINE 30 MG/ML IJ SOLN
30.0000 mg | Freq: Once | INTRAMUSCULAR | Status: AC
  Administered 2023-06-30: 30 mg via INTRAMUSCULAR

## 2023-06-30 MED ORDER — KETOROLAC TROMETHAMINE 30 MG/ML IJ SOLN
INTRAMUSCULAR | Status: AC
  Filled 2023-06-30: qty 1

## 2023-06-30 MED ORDER — IBUPROFEN 800 MG PO TABS: 800.0000 mg | ORAL_TABLET | Freq: Three times a day (TID) | ORAL | 0 refills | Status: DC | PRN

## 2023-06-30 MED ORDER — PENICILLIN V POTASSIUM 500 MG PO TABS
500.0000 mg | ORAL_TABLET | Freq: Three times a day (TID) | ORAL | 0 refills | Status: AC
Start: 2023-06-30 — End: 2023-07-14

## 2023-06-30 NOTE — ED Provider Notes (Signed)
[facility]    CSN: [id number] Arrival date & time: 06/30/23  1708    HISTORY   Chief Complaint  Patient presents with   Dental Pain   HPI Courtney Ashley is a pleasant, 23 y.o. female who presents to urgent care today. Patient reports dental infection in her left first upper molar.  Patient states she reached out to her dentist today and schedule an appointment for early February.  Patient states her dentist advised her to come to urgent care to get a prescription for antibiotics.  Patient reports being in a lot of pain at this time.  The history is provided by the patient.   Past Medical History:  Diagnosis Date   ADHD (attention deficit hyperactivity disorder)    childhood   Anemia    Bipolar 2 disorder (HCC)    Chest pain    Delta-9-tetrahydrocannabinol (THC) dependence (HCC)    GAD (generalized anxiety disorder)    Insomnia    Nexplanon in place 2018   Psychosis [facility])    Tobacco dependence    Patient Active Problem List   Diagnosis Date Noted   Cannabis abuse 04/11/2023   Bipolar 2 disorder (HCC) 02/24/2023   Insomnia 02/24/2023   Generalized anxiety disorder 02/24/2023   Delta-9-tetrahydrocannabinol (THC) dependence (HCC) 02/24/2023   Tobacco dependence 02/24/2023   Anxiety 08/14/2019   Irregular menstrual bleeding 02/22/2019   Uses contraceptive implant for birth control 02/22/2019   Past Surgical History:  Procedure Laterality Date   TONSILLECTOMY AND ADENOIDECTOMY     OB History     Gravida  1   Para  1   Term  1   Preterm      AB      Living  1      SAB      IAB      Ectopic      Multiple      Live Births  1          Home Medications    Prior to Admission medications   Medication Sig Start Date End Date Taking? Authorizing Provider  ARIPiprazole (ABILIFY) 10 MG tablet Take 1 tablet (10 mg total) by mouth daily. 04/07/23  Yes Carlyn Reichert, MD  ibuprofen (ADVIL) 800 MG tablet Take 1 tablet (800 mg total) by mouth  every 8 (eight) hours as needed for up to 21 doses for fever, headache, mild pain (pain score 1-3) or moderate pain (pain score 4-6). 06/30/23  Yes Theadora Rama Scales, PA-C  penicillin v potassium (VEETID) 500 MG tablet Take 1 tablet (500 mg total) by mouth 3 (three) times daily for 14 days. 06/30/23 07/14/23 Yes Theadora Rama Scales, PA-C  sertraline (ZOLOFT) 100 MG tablet Take 1 tablet (100 mg total) by mouth daily. 04/07/23  Yes Carlyn Reichert, MD    Family History Family History  Problem Relation Age of Onset   Diabetes Maternal Grandmother    Social History Social History   Tobacco Use   Smoking status: Some Days    Types: Cigarettes   Smokeless tobacco: Never  Vaping Use   Vaping status: Never Used  Substance Use Topics   Alcohol use: Not Currently   Drug use: Yes    Types: Marijuana   Allergies   Fish allergy  Review of Systems Review of Systems Pertinent findings revealed after performing a 14 point review of systems has been noted in the history of present illness.  Physical Exam Vital Signs BP (!) 148/87 (BP Location: Left  Arm)   Pulse 67   Temp 99.1 F (37.3 C) (Oral)   Resp 18   SpO2 97%   No data found.  Physical Exam Vitals and nursing note reviewed.  Constitutional:      General: She is awake. She is in acute distress.     Appearance: Normal appearance. She is well-developed and well-groomed.  HENT:     Head: Normocephalic and atraumatic.     Mouth/Throat:   Eyes:     Pupils: Pupils are equal, round, and reactive to light.  Cardiovascular:     Rate and Rhythm: Normal rate and regular rhythm.  Pulmonary:     Effort: Pulmonary effort is normal.     Breath sounds: Normal breath sounds.  Musculoskeletal:        General: Normal range of motion.     Cervical back: Normal range of motion and neck supple.  Skin:    General: Skin is warm and dry.  Neurological:     General: No focal deficit present.     Mental Status: She is alert and oriented  to person, place, and time. Mental status is at baseline.  Psychiatric:        Mood and Affect: Mood normal.        Behavior: Behavior normal. Behavior is cooperative.        Thought Content: Thought content normal.        Judgment: Judgment normal.     Visual Acuity Right Eye Distance:   Left Eye Distance:   Bilateral Distance:    Right Eye Near:   Left Eye Near:    Bilateral Near:     UC Couse / Diagnostics / Procedures:     Radiology No results found.  Procedures Procedures (including critical care time) EKG  Pending results:  Labs Reviewed - No data to display  Medications Ordered in UC: Medications  ketorolac (TORADOL) 30 MG/ML injection 30 mg (has no administration in time range)    UC Diagnoses / Final Clinical Impressions(s)   I have reviewed the triage vital signs and the nursing notes.  Pertinent labs & imaging results that were available during my care of the patient were reviewed by me and considered in my medical decision making (see chart for details).    Final diagnoses:  Pain due to dental caries  Dental abscess   Patient provided with an injection of ketorolac during her visit for pain relief.  Prescription for penicillin sent to pharmacy along with a prescription for ibuprofen 800 mg every 6-8 hours as needed for pain.  Patient encouraged to be sure that she keeps her appointment with her dentist to have the tooth addressed.  Emergency precautions advised.  Please see discharge instructions below for details of plan of care as provided to patient. ED Prescriptions     Medication Sig Dispense Auth. Provider   penicillin v potassium (VEETID) 500 MG tablet Take 1 tablet (500 mg total) by mouth 3 (three) times daily for 14 days. 42 tablet Theadora Rama Scales, PA-C   ibuprofen (ADVIL) 800 MG tablet Take 1 tablet (800 mg total) by mouth every 8 (eight) hours as needed for up to 21 doses for fever, headache, mild pain (pain score 1-3) or moderate pain  (pain score 4-6). 21 tablet Theadora Rama Scales, PA-C      PDMP not reviewed this encounter.  Pending results:  Labs Reviewed - No data to display    Discharge Instructions      Please  read below to learn more about the medications, dosages and frequencies that I recommend to help alleviate your symptoms and to get you feeling better soon:   Penicillin V:  Please take one (1) dose three times daily for 14 days.  This antibiotic can cause upset stomach, this will resolve once antibiotics are complete.  You are welcome to take a probiotic, eat yogurt, take Imodium while taking this medication.  Please avoid other systemic medications such as Maalox, Pepto-Bismol or milk of magnesia as they can interfere with the body's ability to absorb the antibiotics.   Please be sure that you follow-up with your dentist to have the tooth addressed.  Not having the tooth addressed will cause the infection to come back again.  Thank you for visiting [facility] today.    Disposition Upon Discharge:  Condition: stable for discharge home  Patient presented with an acute illness with associated systemic symptoms and significant discomfort requiring urgent management. In my opinion, this is a condition that a prudent lay person (someone who possesses an average knowledge of health and medicine) may potentially expect to result in complications if not addressed urgently such as respiratory distress, impairment of bodily function or dysfunction of bodily organs.   Routine symptom specific, illness specific and/or disease specific instructions were discussed with the patient and/or caregiver at length.   As such, the patient has been evaluated and assessed, work-up was performed and treatment was provided in alignment with urgent care protocols and evidence based medicine.  Patient/parent/caregiver has been advised that the patient may require follow up for further testing and treatment if the  symptoms continue in spite of treatment, as clinically indicated and appropriate.  Patient/parent/caregiver has been advised to return to the [facility] or PCP if no better; to PCP or the Emergency Department if new signs and symptoms develop, or if the current signs or symptoms continue to change or worsen for further workup, evaluation and treatment as clinically indicated and appropriate  The patient will follow up with their current PCP if and as advised. If the patient does not currently have a PCP we will assist them in obtaining one.   The patient may need specialty follow up if the symptoms continue, in spite of conservative treatment and management, for further workup, evaluation, consultation and treatment as clinically indicated and appropriate.  Patient/parent/caregiver verbalized understanding and agreement of plan as discussed.  All questions were addressed during visit.  Please see discharge instructions below for further details of plan.  This office note has been dictated using Teaching laboratory technician.  Unfortunately, this method of dictation can sometimes lead to typographical or grammatical errors.  I apologize for your inconvenience in advance if this occurs.  Please do not hesitate to reach out to me if clarification is needed.      Theadora Rama Scales, [place] 06/30/23 [phone]

## 2023-06-30 NOTE — Discharge Instructions (Signed)
Please read below to learn more about the medications, dosages and frequencies that I recommend to help alleviate your symptoms and to get you feeling better soon:   Penicillin V:  Please take one (1) dose three times daily for 14 days.  This antibiotic can cause upset stomach, this will resolve once antibiotics are complete.  You are welcome to take a probiotic, eat yogurt, take Imodium while taking this medication.  Please avoid other systemic medications such as Maalox, Pepto-Bismol or milk of magnesia as they can interfere with the body's ability to absorb the antibiotics.   Please be sure that you follow-up with your dentist to have the tooth addressed.  Not having the tooth addressed will cause the infection to come back again.  Thank you for visiting [facility] today.

## 2023-06-30 NOTE — ED Triage Notes (Signed)
Patient c/o tooth abscess. She states she called the dentist and was told to come here to get an antibiotic.

## 2023-07-12 ENCOUNTER — Ambulatory Visit: Payer: No Typology Code available for payment source | Attending: Cardiology | Admitting: Cardiology

## 2024-05-07 ENCOUNTER — Ambulatory Visit: Payer: MEDICAID | Admitting: Medical

## 2024-05-16 ENCOUNTER — Emergency Department (HOSPITAL_COMMUNITY): Admission: EM | Admit: 2024-05-16 | Discharge: 2024-05-17 | Discharge status: Against Medical Advice | Payer: MEDICAID

## 2024-05-16 ENCOUNTER — Emergency Department (HOSPITAL_COMMUNITY): Payer: MEDICAID

## 2024-05-16 ENCOUNTER — Other Ambulatory Visit: Payer: Self-pay

## 2024-05-16 DIAGNOSIS — Z5321 Procedure and treatment not carried out due to patient leaving prior to being seen by health care provider: Secondary | ICD-10-CM | POA: Insufficient documentation

## 2024-05-16 DIAGNOSIS — R079 Chest pain, unspecified: Secondary | ICD-10-CM | POA: Insufficient documentation

## 2024-05-16 LAB — BASIC METABOLIC PANEL WITH GFR
Anion gap: 13 (ref 5–15)
BUN: 11 mg/dL (ref 6–20)
CO2: 22 mmol/L (ref 22–32)
Calcium: 9 mg/dL (ref 8.9–10.3)
Chloride: 103 mmol/L (ref 98–111)
Creatinine, Ser: 1.08 mg/dL — ABNORMAL HIGH (ref 0.44–1.00)
GFR, Estimated: >60 mL/min (ref >60)
Glucose, Bld: 74 mg/dL (ref 70–99)
Potassium: 3.3 mmol/L — ABNORMAL LOW (ref 3.5–5.1)
Sodium: 138 mmol/L (ref 135–145)

## 2024-05-16 LAB — CBC
HCT: 37.2 % (ref 36.0–46.0)
Hemoglobin: 12 g/dL (ref 12.0–15.0)
MCH: 26.4 pg (ref 26.0–34.0)
MCHC: 32.3 g/dL (ref 30.0–36.0)
MCV: 81.8 fL (ref 80.0–100.0)
Platelets: 270 K/uL (ref 150–400)
RBC: 4.55 MIL/uL (ref 3.87–5.11)
RDW: 16.6 % — ABNORMAL HIGH (ref 11.5–15.5)
WBC: 8 K/uL (ref 4.0–10.5)
nRBC: 0 % (ref 0.0–0.2)

## 2024-05-16 LAB — HCG, SERUM, QUALITATIVE: Preg, Serum: NEGATIVE

## 2024-05-16 LAB — TROPONIN I (HIGH SENSITIVITY): Troponin I (High Sensitivity): 5 ng/L (ref <18)

## 2024-05-16 NOTE — ED Triage Notes (Signed)
 Pt c/o constant dull chest pain. Pt reports that her PCP encouraged her to stop smoking but she has yet to stop.

## 2024-05-17 LAB — TROPONIN I (HIGH SENSITIVITY): Troponin I (High Sensitivity): 4 ng/L (ref <18)

## 2024-05-17 NOTE — ED Notes (Signed)
 Pt found over by the vending machines

## 2024-05-17 NOTE — ED Notes (Addendum)
 Pt seen leaving. Been gone for an hour.

## 2024-05-17 NOTE — ED Notes (Signed)
 Patient stated she was leaving due to long wait, and will follow up with her results on mychart.

## 2024-05-21 ENCOUNTER — Other Ambulatory Visit: Payer: Self-pay

## 2024-05-21 ENCOUNTER — Ambulatory Visit (HOSPITAL_COMMUNITY): Admission: EM | Admit: 2024-05-21 | Discharge: 2024-05-21 | Discharge status: Against Medical Advice

## 2024-05-21 ENCOUNTER — Ambulatory Visit (HOSPITAL_COMMUNITY)
Admission: EM | Admit: 2024-05-21 | Discharge: 2024-05-22 | Disposition: A | Payer: MEDICAID | Attending: Nurse Practitioner | Admitting: Nurse Practitioner

## 2024-05-21 ENCOUNTER — Emergency Department (HOSPITAL_COMMUNITY)
Admission: EM | Admit: 2024-05-21 | Discharge: 2024-05-21 | Discharge status: Against Medical Advice | Payer: MEDICAID | Attending: Emergency Medicine | Admitting: Emergency Medicine

## 2024-05-21 DIAGNOSIS — G4700 Insomnia, unspecified: Secondary | ICD-10-CM | POA: Diagnosis present

## 2024-05-21 DIAGNOSIS — F3164 Bipolar disorder, current episode mixed, severe, with psychotic features: Secondary | ICD-10-CM

## 2024-05-21 DIAGNOSIS — Z5321 Procedure and treatment not carried out due to patient leaving prior to being seen by health care provider: Secondary | ICD-10-CM | POA: Diagnosis not present

## 2024-05-21 DIAGNOSIS — Y900 Blood alcohol level of less than 20 mg/100 ml: Secondary | ICD-10-CM | POA: Diagnosis not present

## 2024-05-21 DIAGNOSIS — Z79899 Other long term (current) drug therapy: Secondary | ICD-10-CM | POA: Insufficient documentation

## 2024-05-21 DIAGNOSIS — Z91148 Patient's other noncompliance with medication regimen for other reason: Secondary | ICD-10-CM | POA: Insufficient documentation

## 2024-05-21 LAB — CBC WITH DIFFERENTIAL/PLATELET
Abs Immature Granulocytes: 0.02 K/uL (ref 0.00–0.07)
Basophils Absolute: 0.1 K/uL (ref 0.0–0.1)
Basophils Relative: 1 %
Eosinophils Absolute: 0 K/uL (ref 0.0–0.5)
Eosinophils Relative: 1 %
HCT: 39.9 % (ref 36.0–46.0)
Hemoglobin: 13.1 g/dL (ref 12.0–15.0)
Immature Granulocytes: 0 %
Lymphocytes Relative: 46 %
Lymphs Abs: 3.9 K/uL (ref 0.7–4.0)
MCH: 26.8 pg (ref 26.0–34.0)
MCHC: 32.8 g/dL (ref 30.0–36.0)
MCV: 81.6 fL (ref 80.0–100.0)
Monocytes Absolute: 0.6 K/uL (ref 0.1–1.0)
Monocytes Relative: 7 %
Neutro Abs: 3.7 K/uL (ref 1.7–7.7)
Neutrophils Relative %: 45 %
Platelets: 244 K/uL (ref 150–400)
RBC: 4.89 MIL/uL (ref 3.87–5.11)
RDW: 17.2 % — ABNORMAL HIGH (ref 11.5–15.5)
WBC: 8.3 K/uL (ref 4.0–10.5)
nRBC: 0 % (ref 0.0–0.2)

## 2024-05-21 LAB — LIPID PANEL
Cholesterol: 165 mg/dL (ref 0–200)
HDL: 59 mg/dL (ref >40)
LDL Cholesterol: 92 mg/dL (ref 0–99)
Total CHOL/HDL Ratio: 2.8 ratio
Triglycerides: 72 mg/dL (ref <150)
VLDL: 14 mg/dL (ref 0–40)

## 2024-05-21 LAB — HEMOGLOBIN A1C
Hgb A1c MFr Bld: 5.2 % (ref 4.8–5.6)
Mean Plasma Glucose: 102.54 mg/dL

## 2024-05-21 LAB — HIV ANTIBODY (ROUTINE TESTING W REFLEX): HIV Screen 4th Generation wRfx: NONREACTIVE

## 2024-05-21 LAB — CBC
HCT: 40.7 % (ref 36.0–46.0)
Hemoglobin: 13.4 g/dL (ref 12.0–15.0)
MCH: 27 pg (ref 26.0–34.0)
MCHC: 32.9 g/dL (ref 30.0–36.0)
MCV: 81.9 fL (ref 80.0–100.0)
Platelets: 287 K/uL (ref 150–400)
RBC: 4.97 MIL/uL (ref 3.87–5.11)
RDW: 16.9 % — ABNORMAL HIGH (ref 11.5–15.5)
WBC: 6.5 K/uL (ref 4.0–10.5)
nRBC: 0 % (ref 0.0–0.2)

## 2024-05-21 LAB — COMPREHENSIVE METABOLIC PANEL WITH GFR
ALT: 11 U/L (ref 0–44)
AST: 16 U/L (ref 15–41)
Albumin: 4 g/dL (ref 3.5–5.0)
Alkaline Phosphatase: 33 U/L — ABNORMAL LOW (ref 38–126)
Anion gap: 14 (ref 5–15)
BUN: 6 mg/dL (ref 6–20)
CO2: 18 mmol/L — ABNORMAL LOW (ref 22–32)
Calcium: 8.9 mg/dL (ref 8.9–10.3)
Chloride: 105 mmol/L (ref 98–111)
Creatinine, Ser: 0.85 mg/dL (ref 0.44–1.00)
GFR, Estimated: >60 mL/min (ref >60)
Glucose, Bld: 92 mg/dL (ref 70–99)
Potassium: 3.1 mmol/L — ABNORMAL LOW (ref 3.5–5.1)
Sodium: 137 mmol/L (ref 135–145)
Total Bilirubin: 1 mg/dL (ref 0.0–1.2)
Total Protein: 6.8 g/dL (ref 6.5–8.1)

## 2024-05-21 LAB — TSH: TSH: 0.657 u[IU]/mL (ref 0.350–4.500)

## 2024-05-21 LAB — VITAMIN D 25 HYDROXY (VIT D DEFICIENCY, FRACTURES): Vit D, 25-Hydroxy: 11 ng/mL — ABNORMAL LOW (ref 30–100)

## 2024-05-21 LAB — POCT URINE DRUG SCREEN - MANUAL ENTRY (I-SCREEN)
POC Amphetamine UR: NOT DETECTED
POC Buprenorphine (BUP): NOT DETECTED
POC Cocaine UR: NOT DETECTED
POC Marijuana UR: POSITIVE — AB
POC Methadone UR: NOT DETECTED
POC Methamphetamine UR: NOT DETECTED
POC Morphine: NOT DETECTED
POC Oxazepam (BZO): NOT DETECTED
POC Oxycodone UR: NOT DETECTED
POC Secobarbital (BAR): NOT DETECTED

## 2024-05-21 LAB — POC URINE PREG, ED: Preg Test, Ur: NEGATIVE

## 2024-05-21 LAB — HCG, SERUM, QUALITATIVE: Preg, Serum: NEGATIVE

## 2024-05-21 LAB — ETHANOL
Alcohol, Ethyl (B): <15 mg/dL (ref <15)
Alcohol, Ethyl (B): <15 mg/dL (ref <15)

## 2024-05-21 MED ORDER — DIPHENHYDRAMINE HCL 50 MG/ML IJ SOLN: 50.0000 mg | Freq: Three times a day (TID) | INTRAMUSCULAR | Status: DC | PRN

## 2024-05-21 MED ORDER — LORAZEPAM 2 MG/ML IJ SOLN: 2.0000 mg | Freq: Three times a day (TID) | INTRAMUSCULAR | Status: DC | PRN

## 2024-05-21 MED ORDER — ALUM & MAG HYDROXIDE-SIMETH 200-200-20 MG/5ML PO SUSP: 30.0000 mL | ORAL | Status: DC | PRN

## 2024-05-21 MED ORDER — HALOPERIDOL LACTATE 5 MG/ML IJ SOLN: 10.0000 mg | Freq: Three times a day (TID) | INTRAMUSCULAR | Status: DC | PRN

## 2024-05-21 MED ORDER — HYDROXYZINE HCL 25 MG PO TABS: 25.0000 mg | ORAL_TABLET | Freq: Three times a day (TID) | ORAL | Status: DC | PRN

## 2024-05-21 MED ORDER — ACETAMINOPHEN 325 MG PO TABS: 650.0000 mg | ORAL_TABLET | Freq: Four times a day (QID) | ORAL | Status: DC | PRN

## 2024-05-21 MED ORDER — POTASSIUM CHLORIDE CRYS ER 20 MEQ PO TBCR
40.0000 meq | EXTENDED_RELEASE_TABLET | Freq: Once | ORAL | Status: AC
  Administered 2024-05-21: 23:00:00 40 meq via ORAL
  Filled 2024-05-21: qty 2

## 2024-05-21 MED ORDER — MAGNESIUM HYDROXIDE 400 MG/5ML PO SUSP: 30.0000 mL | Freq: Every day | ORAL | Status: DC | PRN

## 2024-05-21 MED ORDER — NICOTINE 14 MG/24HR TD PT24: 14.0000 mg | MEDICATED_PATCH | Freq: Every day | TRANSDERMAL | Status: DC

## 2024-05-21 MED ORDER — DIPHENHYDRAMINE HCL 50 MG PO CAPS: 50.0000 mg | ORAL_CAPSULE | Freq: Three times a day (TID) | ORAL | Status: DC | PRN

## 2024-05-21 MED ORDER — HALOPERIDOL 5 MG PO TABS: 5.0000 mg | ORAL_TABLET | Freq: Three times a day (TID) | ORAL | Status: DC | PRN

## 2024-05-21 MED ORDER — HALOPERIDOL LACTATE 5 MG/ML IJ SOLN: 5.0000 mg | Freq: Three times a day (TID) | INTRAMUSCULAR | Status: DC | PRN

## 2024-05-21 NOTE — ED Triage Notes (Addendum)
 Pt arrives POV with grandmother with complaints of not being able to sleep for 3 days and she started hearing voices today. Pt states that she was put on zoloft  last year, but stopped taking it and stopped seeing her doctors after the first dose of medication. Denies SI/HI Per grandmother and pt, every time she moves back in with pt mother she does not take her medications and has multiple problems like these.

## 2024-05-21 NOTE — ED Provider Notes (Signed)
 Patient eloped from the emergency room with grandmother.  Did not seem to be in any distress.  No revealed SI/HI.  Patient was voluntary.   Bari Roxie HERO, DO 05/21/24 1019
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
## 2024-05-21 NOTE — ED Notes (Addendum)
 Pt admitted to observation unit requesting medication for my Bipolar, my eating disorder, the voices, everything. Pt states, the voices stopped 5 min ago. Do that mean I can leave now (laughing). Writer informed pt that a provider will speak with her tomorrow and they can discuss the POC then. Pt agreed. Pt presents with bizarre behaviors. When writer asked pt what type of eating disorder do she have, pt stated, I eat a little bit about 5 times a day. I don't eat a lot of food. Pt denies binging or purging. Calm, cooperative throughout interview process. Skin assessment completed. Oriented to unit. Meal and drink offered. At current, pt denies SI/HI/AVH. Pt verbally contract for safety. Will monitor for safety.

## 2024-05-21 NOTE — ED Notes (Signed)
 RN relayed report to [facility] @ [facility] related to pending patient transfer via safe transport

## 2024-05-22 ENCOUNTER — Encounter (HOSPITAL_COMMUNITY): Payer: Self-pay | Admitting: Nurse Practitioner

## 2024-05-22 ENCOUNTER — Inpatient Hospital Stay (HOSPITAL_COMMUNITY)
Admission: AD | Admit: 2024-05-22 | Discharge: 2024-05-26 | DRG: 885 | Disposition: A | Discharge status: Home / Self Care | Payer: MEDICAID | Source: Intra-hospital | Attending: Student in an Organized Health Care Education/Training Program | Admitting: Student in an Organized Health Care Education/Training Program

## 2024-05-22 ENCOUNTER — Encounter (HOSPITAL_COMMUNITY): Payer: Self-pay

## 2024-05-22 DIAGNOSIS — Z79899 Other long term (current) drug therapy: Secondary | ICD-10-CM | POA: Diagnosis not present

## 2024-05-22 DIAGNOSIS — F3164 Bipolar disorder, current episode mixed, severe, with psychotic features: Principal | ICD-10-CM | POA: Diagnosis present

## 2024-05-22 DIAGNOSIS — G4700 Insomnia, unspecified: Secondary | ICD-10-CM | POA: Diagnosis present

## 2024-05-22 DIAGNOSIS — Z833 Family history of diabetes mellitus: Secondary | ICD-10-CM

## 2024-05-22 DIAGNOSIS — F1220 Cannabis dependence, uncomplicated: Secondary | ICD-10-CM | POA: Diagnosis present

## 2024-05-22 DIAGNOSIS — F411 Generalized anxiety disorder: Secondary | ICD-10-CM | POA: Diagnosis present

## 2024-05-22 DIAGNOSIS — Z716 Tobacco abuse counseling: Secondary | ICD-10-CM | POA: Diagnosis not present

## 2024-05-22 DIAGNOSIS — F17210 Nicotine dependence, cigarettes, uncomplicated: Secondary | ICD-10-CM | POA: Diagnosis present

## 2024-05-22 DIAGNOSIS — K5900 Constipation, unspecified: Secondary | ICD-10-CM | POA: Diagnosis not present

## 2024-05-22 DIAGNOSIS — Z818 Family history of other mental and behavioral disorders: Secondary | ICD-10-CM

## 2024-05-22 DIAGNOSIS — R441 Visual hallucinations: Secondary | ICD-10-CM | POA: Diagnosis present

## 2024-05-22 DIAGNOSIS — Z91148 Patient's other noncompliance with medication regimen for other reason: Secondary | ICD-10-CM

## 2024-05-22 LAB — COMPREHENSIVE METABOLIC PANEL WITH GFR
ALT: 9 U/L (ref 0–44)
AST: 16 U/L (ref 15–41)
Albumin: 4.9 g/dL (ref 3.5–5.0)
Alkaline Phosphatase: 45 U/L (ref 38–126)
Anion gap: 26 — ABNORMAL HIGH (ref 5–15)
BUN: 7 mg/dL (ref 6–20)
CO2: 15 mmol/L — ABNORMAL LOW (ref 22–32)
Calcium: 10.3 mg/dL (ref 8.9–10.3)
Chloride: 101 mmol/L (ref 98–111)
Creatinine, Ser: 0.86 mg/dL (ref 0.44–1.00)
GFR, Estimated: >60 mL/min (ref >60)
Glucose, Bld: 55 mg/dL — ABNORMAL LOW (ref 70–99)
Potassium: 3.7 mmol/L (ref 3.5–5.1)
Sodium: 141 mmol/L (ref 135–145)
Total Bilirubin: 0.4 mg/dL (ref 0.0–1.2)
Total Protein: 7.4 g/dL (ref 6.5–8.1)

## 2024-05-22 LAB — SYPHILIS: RPR W/REFLEX TO RPR TITER AND TREPONEMAL ANTIBODIES, TRADITIONAL SCREENING AND DIAGNOSIS ALGORITHM: RPR Ser Ql: NONREACTIVE

## 2024-05-22 LAB — MAGNESIUM: Magnesium: 2 mg/dL (ref 1.7–2.4)

## 2024-05-22 MED ORDER — TRAZODONE HCL 50 MG PO TABS
50.0000 mg | ORAL_TABLET | Freq: Every evening | ORAL | Status: DC | PRN
  Administered 2024-05-22 – 2024-05-24 (×3): 50 mg via ORAL
  Filled 2024-05-22 (×3): qty 1

## 2024-05-22 MED ORDER — HALOPERIDOL 5 MG PO TABS: 5.0000 mg | ORAL_TABLET | Freq: Three times a day (TID) | ORAL | Status: DC | PRN

## 2024-05-22 MED ORDER — NICOTINE POLACRILEX 2 MG MT GUM: 2.0000 mg | CHEWING_GUM | OROMUCOSAL | Status: DC | PRN

## 2024-05-22 MED ORDER — DIPHENHYDRAMINE HCL 50 MG/ML IJ SOLN: 50.0000 mg | Freq: Three times a day (TID) | INTRAMUSCULAR | Status: DC | PRN

## 2024-05-22 MED ORDER — LORAZEPAM 2 MG/ML IJ SOLN: 2.0000 mg | Freq: Three times a day (TID) | INTRAMUSCULAR | Status: DC | PRN

## 2024-05-22 MED ORDER — ARIPIPRAZOLE 5 MG PO TABS
5.0000 mg | ORAL_TABLET | Freq: Once | ORAL | Status: AC
  Administered 2024-05-22: 13:00:00 5 mg via ORAL
  Filled 2024-05-22: qty 1

## 2024-05-22 MED ORDER — DIPHENHYDRAMINE HCL 25 MG PO CAPS: 50.0000 mg | ORAL_CAPSULE | Freq: Three times a day (TID) | ORAL | Status: DC | PRN

## 2024-05-22 MED ORDER — SERTRALINE HCL 50 MG PO TABS
50.0000 mg | ORAL_TABLET | Freq: Every day | ORAL | Status: DC
  Administered 2024-05-22 – 2024-05-26 (×5): 50 mg via ORAL
  Filled 2024-05-22 (×5): qty 1

## 2024-05-22 MED ORDER — HALOPERIDOL LACTATE 5 MG/ML IJ SOLN: 5.0000 mg | Freq: Three times a day (TID) | INTRAMUSCULAR | Status: DC | PRN

## 2024-05-22 MED ORDER — HALOPERIDOL LACTATE 5 MG/ML IJ SOLN: 10.0000 mg | Freq: Three times a day (TID) | INTRAMUSCULAR | Status: DC | PRN

## 2024-05-22 MED ORDER — HYDROXYZINE HCL 25 MG PO TABS
25.0000 mg | ORAL_TABLET | Freq: Three times a day (TID) | ORAL | Status: DC | PRN
  Administered 2024-05-22 – 2024-05-24 (×3): 25 mg via ORAL
  Filled 2024-05-22 (×3): qty 1

## 2024-05-22 MED ORDER — ARIPIPRAZOLE 10 MG PO TABS
10.0000 mg | ORAL_TABLET | Freq: Every day | ORAL | Status: DC
  Administered 2024-05-23 – 2024-05-26 (×4): 10 mg via ORAL
  Filled 2024-05-22 (×4): qty 1

## 2024-05-22 NOTE — Group Note (Signed)
 Date:  05/22/2024 Time:  9:44 AM  Group Topic/Focus:  Goals Group:   The focus of this group is to help patients establish daily goals to achieve during treatment and discuss how the patient can incorporate goal setting into their daily lives to aide in recovery. Orientation:   The focus of this group is to educate the patient on the purpose and policies of crisis stabilization and provide a format to answer questions about their admission.  The group details unit policies and expectations of patients while admitted.    Participation Level:  Did Not Attend   Courtney Ashley 05/22/2024, 9:44 AM

## 2024-05-22 NOTE — Group Note (Signed)
 Date:  05/22/2024 Time:  3:57 PM  Group Topic/Focus: Motivation/Inspiration/Stages of Change  Stages of Change:   The focus of this group is to explain the stages of change and help patients identify changes they want to make upon discharge.    Participation Level:  Did Not Attend   Shanda JONETTA Challenger 05/22/2024, 3:57 PM

## 2024-05-22 NOTE — Progress Notes (Signed)
°   05/22/24 1000  Psych Admission Type (Psych Patients Only)  Admission Status Voluntary  Psychosocial Assessment  Patient Complaints None  Eye Contact Brief  Facial Expression Flat  Affect Flat  Speech Logical/coherent  Interaction Minimal  Motor Activity Other (Comment) (WDL)  Appearance/Hygiene Unremarkable  Behavior Characteristics Appropriate to situation  Mood Pleasant  Thought Process  Coherency WDL  Content WDL  Delusions None reported or observed  Perception WDL  Hallucination None reported or observed  Judgment Poor  Confusion None  Danger to Self  Current suicidal ideation? Denies  Agreement Not to Harm Self Yes  Description of Agreement Verbal  Danger to Others  Danger to Others None reported or observed

## 2024-05-22 NOTE — Group Note (Signed)
 Date:  05/22/2024 Time:  12:43 PM  Group Topic/Focus: Pharmacy    Pt did not attend pharmacy group  Courtney Ashley Devina Bezold 05/22/2024, 12:43 PM

## 2024-05-22 NOTE — Plan of Care (Signed)
   Problem: Education: Goal: Knowledge of [facility] Education information/materials will improve Outcome: Progressing Goal: Emotional status will improve Outcome: Progressing Goal: Mental status will improve Outcome: Progressing Goal: Verbalization of understanding the information provided will improve Outcome: Progressing

## 2024-05-22 NOTE — Plan of Care (Signed)
   Problem: Education: Goal: Knowledge of [facility] Education information/materials will improve Outcome: Progressing Goal: Emotional status will improve Outcome: Progressing Goal: Mental status will improve Outcome: Progressing

## 2024-05-22 NOTE — BH IP Treatment Plan (Signed)
 Interdisciplinary Treatment and Diagnostic Plan Update  05/22/2024 Time of Session: 1000 AM Courtney Ashley MRN: [id number]  Principal Diagnosis: Bipolar affective disorder, mixed, severe, with psychotic behavior (HCC)  Secondary Diagnoses: Principal Problem:   Bipolar affective disorder, mixed, severe, with psychotic behavior (HCC) Active Problems:   Generalized anxiety disorder   Delta-9-tetrahydrocannabinol (THC) dependence (HCC)   Current Medications:  Current Facility-Administered Medications  Medication Dose Route Frequency Provider Last Rate Last Admin   [START ON 05/23/2024] ARIPiprazole  (ABILIFY ) tablet 10 mg  10 mg Oral Daily Nwoko, Agnes I, NP       haloperidol  (HALDOL ) tablet 5 mg  5 mg Oral TID PRN Onuoha, Chinwendu V, NP       And   diphenhydrAMINE  (BENADRYL ) capsule 50 mg  50 mg Oral TID PRN Onuoha, Chinwendu V, NP       haloperidol  lactate (HALDOL ) injection 5 mg  5 mg Intramuscular TID PRN Onuoha, Chinwendu V, NP       And   diphenhydrAMINE  (BENADRYL ) injection 50 mg  50 mg Intramuscular TID PRN Onuoha, Chinwendu V, NP       And   LORazepam  (ATIVAN ) injection 2 mg  2 mg Intramuscular TID PRN Onuoha, Chinwendu V, NP       haloperidol  lactate (HALDOL ) injection 10 mg  10 mg Intramuscular TID PRN Onuoha, Chinwendu V, NP       And   diphenhydrAMINE  (BENADRYL ) injection 50 mg  50 mg Intramuscular TID PRN Onuoha, Chinwendu V, NP       And   LORazepam  (ATIVAN ) injection 2 mg  2 mg Intramuscular TID PRN Onuoha, Chinwendu V, NP       hydrOXYzine  (ATARAX ) tablet 25 mg  25 mg Oral TID PRN Collene Gouge I, NP       nicotine  polacrilex (NICORETTE ) gum 2 mg  2 mg Oral PRN Bouchard, Marc A, DO       sertraline  (ZOLOFT ) tablet 50 mg  50 mg Oral Daily Nwoko, Agnes I, NP   50 mg at 05/22/24 1256   traZODone  (DESYREL ) tablet 50 mg  50 mg Oral QHS PRN Nwoko, Agnes I, NP       PTA Medications: No medications prior to admission.    Patient Stressors:    Patient Strengths:     Treatment Modalities: Medication Management, Group therapy, Case management,  1 to 1 session with clinician, Psychoeducation, Recreational therapy.   Physician Treatment Plan for Primary Diagnosis: Bipolar affective disorder, mixed, severe, with psychotic behavior (HCC) Long Term Goal(s): Improvement in symptoms so as ready for discharge   Short Term Goals: Ability to identify and develop effective coping behaviors will improve Ability to maintain clinical measurements within normal limits will improve Compliance with prescribed medications will improve Ability to identify triggers associated with substance abuse/mental health issues will improve Ability to identify changes in lifestyle to reduce recurrence of condition will improve Ability to verbalize feelings will improve Ability to disclose and discuss suicidal ideas Ability to demonstrate self-control will improve  Medication Management: Evaluate patient's response, side effects, and tolerance of medication regimen.  Therapeutic Interventions: 1 to 1 sessions, Unit Group sessions and Medication administration.  Evaluation of Outcomes: Not Progressing  Physician Treatment Plan for Secondary Diagnosis: Principal Problem:   Bipolar affective disorder, mixed, severe, with psychotic behavior (HCC) Active Problems:   Generalized anxiety disorder   Delta-9-tetrahydrocannabinol (THC) dependence (HCC)  Long Term Goal(s): Improvement in symptoms so as ready for discharge   Short Term Goals: Ability to identify and  develop effective coping behaviors will improve Ability to maintain clinical measurements within normal limits will improve Compliance with prescribed medications will improve Ability to identify triggers associated with substance abuse/mental health issues will improve Ability to identify changes in lifestyle to reduce recurrence of condition will improve Ability to verbalize feelings will improve Ability to disclose and  discuss suicidal ideas Ability to demonstrate self-control will improve     Medication Management: Evaluate patient's response, side effects, and tolerance of medication regimen.  Therapeutic Interventions: 1 to 1 sessions, Unit Group sessions and Medication administration.  Evaluation of Outcomes: Not Progressing   RN Treatment Plan for Primary Diagnosis: Bipolar affective disorder, mixed, severe, with psychotic behavior (HCC) Long Term Goal(s): Knowledge of disease and therapeutic regimen to maintain health will improve  Short Term Goals: Ability to remain free from injury will improve, Ability to verbalize frustration and anger appropriately will improve, Ability to demonstrate self-control, Ability to participate in decision making will improve, Ability to verbalize feelings will improve, Ability to disclose and discuss suicidal ideas, Ability to identify and develop effective coping behaviors will improve, and Compliance with prescribed medications will improve  Medication Management: RN will administer medications as ordered by provider, will assess and evaluate patient's response and provide education to patient for prescribed medication. RN will report any adverse and/or side effects to prescribing provider.  Therapeutic Interventions: 1 on 1 counseling sessions, Psychoeducation, Medication administration, Evaluate responses to treatment, Monitor vital signs and CBGs as ordered, Perform/monitor CIWA, COWS, AIMS and Fall Risk screenings as ordered, Perform wound care treatments as ordered.  Evaluation of Outcomes: Not Progressing   LCSW Treatment Plan for Primary Diagnosis: Bipolar affective disorder, mixed, severe, with psychotic behavior (HCC) Long Term Goal(s): Safe transition to appropriate next level of care at discharge, Engage patient in therapeutic group addressing interpersonal concerns.  Short Term Goals: Engage patient in aftercare planning with referrals and resources,  Increase social support, Increase ability to appropriately verbalize feelings, Increase emotional regulation, Facilitate acceptance of mental health diagnosis and concerns, Facilitate patient progression through stages of change regarding substance use diagnoses and concerns, Identify triggers associated with mental health/substance abuse issues, and Increase skills for wellness and recovery  Therapeutic Interventions: Assess for all discharge needs, 1 to 1 time with Social worker, Explore available resources and support systems, Assess for adequacy in community support network, Educate family and significant other(s) on suicide prevention, Complete Psychosocial Assessment, Interpersonal group therapy.  Evaluation of Outcomes: Not Progressing   Progress in Treatment: Attending groups: No. Participating in groups: No. Taking medication as prescribed: Yes. Toleration medication: Yes. Family/Significant other contact made: No, will contact:  pt declined consents  Patient understands diagnosis: Yes. Discussing patient identified problems/goals with staff: Yes. Medical problems stabilized or resolved: Yes. Denies suicidal/homicidal ideation: Yes. Issues/concerns per patient self-inventory: No.  New problem(s) identified: No, Describe:  none  New Short Term/Long Term Goal(s): medication stabilization, elimination of SI thoughts, development of comprehensive mental wellness plan.    Patient Goals:  Find housing and a job, medicine   Discharge Plan or Barriers: Patient recently admitted. CSW will continue to follow and assess for appropriate referrals and possible discharge planning.    Reason for Continuation of Hospitalization: Hallucinations Medication stabilization Other; describe paranoia  Estimated Length of Stay: 5-7 days  Last 3 [place] Suicide Severity Risk Score: Flowsheet Row [facility] (Current[facility] Most recent  reading at 05/22/2024  2:00 AM ED from 05/21/2024 in [facility] Most recent reading at 05/21/2024  6:42 PM ED from 05/21/2024 in [facility] at [facility] Most recent reading at 05/21/2024  4:31 AM  C-SSRS RISK CATEGORY No Risk No Risk No Risk    Last PHQ 2/9 Scores:    08/12/2019    3:18 PM 02/22/2019   10:11 AM 11/21/2018    9:31 AM  Depression screen PHQ 2/9  Decreased Interest 3 0 0  Down, Depressed, Hopeless 1 0 0  PHQ - 2 Score 4 0 0  Altered sleeping 1    Tired, decreased energy 1    Change in appetite 3    Feeling bad or failure about yourself  1    Trouble concentrating 1    Moving slowly or fidgety/restless 1    Suicidal thoughts 0    PHQ-9 Score 12        Data saved with a previous flowsheet row definition    Scribe for Treatment Team: Jenkins LULLA Primer, LCSWA 05/22/2024 1:19 PM

## 2024-05-22 NOTE — Plan of Care (Signed)
   Problem: Education: Goal: Knowledge of [facility] Education information/materials will improve Outcome: Progressing Goal: Emotional status will improve Outcome: Progressing Goal: Mental status will improve Outcome: Progressing Goal: Verbalization of understanding the information provided will improve Outcome: Progressing   Problem: Activity: Goal: Interest or engagement in activities will improve Outcome: Progressing
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
## 2024-05-22 NOTE — BHH Suicide Risk Assessment (Signed)
 Suicide Risk Assessment  Admission Assessment    [facility] Admission Suicide Risk Assessment   Nursing information obtained from:  Patient  Demographic factors:  Adolescent or young adult  Current Mental Status:  NA  Loss Factors:  NA  Historical Factors:  NA  Risk Reduction Factors:  Living with another person, especially a relative  Total Time spent with patient: 1.5 hours including the H&P.  Principal Problem: Bipolar affective disorder, mixed, severe, with psychotic behavior (HCC)  Diagnosis:  Principal Problem:   Bipolar affective disorder, mixed, severe, with psychotic behavior (HCC)  Subjective Data: See H&P.  Continued Clinical Symptoms:  Alcohol Use Disorder Identification Test Final Score (AUDIT): 0 The Alcohol Use Disorders Identification Test, Guidelines for Use in Primary Care, Second Edition.  [employer] [facility]). Score between 0-7:  no or low risk or alcohol related problems. Score between 8-15:  moderate risk of alcohol related problems. Score between 16-19:  high risk of alcohol related problems. Score 20 or above:  warrants further diagnostic evaluation for alcohol dependence and treatment.  CLINICAL FACTORS:   Bipolar Disorder:   Mixed State Alcohol/Substance Abuse/Dependencies Currently Psychotic Unstable or Poor Therapeutic Relationship Previous Psychiatric Diagnoses and Treatments  Musculoskeletal: Strength & Muscle Tone: within normal limits Gait & Station: normal Patient leans: N/A  Psychiatric Specialty Exam:  Presentation  General Appearance:  Fairly Groomed  Eye Contact: Fair  Speech: Clear and Coherent  Speech Volume: Normal  Handedness: Right  Mood and Affect  Mood: Anxious; Irritable; Depressed  Affect: Congruent  Thought Process  Thought Processes: Coherent  Descriptions of Associations:Circumstantial  Orientation:Partial  Thought Content:Illogical  History of Schizophrenia/Schizoaffective  disorder:No data recorded Duration of Psychotic Symptoms:No data recorded Hallucinations:Hallucinations: Auditory; Visual  Ideas of Reference:Paranoia  Suicidal Thoughts:Suicidal Thoughts: No  Homicidal Thoughts:Homicidal Thoughts: No  Sensorium  Memory: Immediate Fair  Judgment: Fair  Insight: Fair  Chartered Certified Accountant: Fair  Attention Span: Fair  Recall: Poor  Fund of Knowledge: Poor  Language: Poor  Psychomotor Activity  Psychomotor Activity: Psychomotor Activity: Normal  Assets  Assets: Resilience  Sleep  Sleep: Sleep: Poor  Physical Exam: See H&P.  Blood pressure 119/74, pulse 65, temperature 97.8 F (36.6 C), temperature source Oral, resp. rate 16, height 5' 6 (1.676 m), weight 61.5 kg, last menstrual period 05/12/2024, SpO2 100%. Body mass index is 21.89 kg/m.  COGNITIVE FEATURES THAT CONTRIBUTE TO RISK:  None    SUICIDE RISK:   Moderate:  Frequent suicidal ideation with limited intensity, and duration, some specificity in terms of plans, no associated intent, good self-control, limited dysphoria/symptomatology, some risk factors present, and identifiable protective factors, including available and accessible social support.  PLAN OF CARE: See H&P.  I certify that inpatient services furnished can reasonably be expected to improve the patient's condition.   Mac Bolster, NP, PMHNP, FNP-BC. 05/22/2024, 9:51 AM

## 2024-05-22 NOTE — Group Note (Signed)
 Date:  05/22/2024 Time:  3:18 PM  Group Topic/Focus: Spiritual Wellness with Chaplain    Pt did not attend spiritual wellness group with the chaplain  Avrielle Fry R Greycen Felter 05/22/2024, 3:18 PM

## 2024-05-22 NOTE — Group Note (Signed)
 Date:  05/22/2024 Time:  10:11 AM  Group Topic/Focus: Recreational Therapy    Pt did not attend recreational therapy group  Courtney Ashley R Courtney Ashley 05/22/2024, 10:11 AM

## 2024-05-22 NOTE — H&P (Signed)
 Psychiatric Admission Assessment Adult  Patient Identification: Courtney Ashley  MRN:  [id number]  Date of Evaluation:  05/22/2024  Chief Complaint: Worsening insomnia of 4 days triggering frightening auditory hallucinations, visual hallucinations & paranoia.  Principal Diagnosis: Bipolar affective disorder, mixed, severe, with psychotic behavior (HCC)  Diagnosis:  Principal Problem:   Bipolar affective disorder, mixed, severe, with psychotic behavior (HCC) Active Problems:   Generalized anxiety disorder   Delta-9-tetrahydrocannabinol (THC) dependence (HCC)  History of Present Illness: This is the the second psychiatric admission evaluation in this [facility] for this 23 year old AA female with prior hx of mental illnesses & substance use disorder (THC). Admitted to this [facility] from the [facility] with complaint of inability to sleep x 4 days. Chart review reports indicated that patient did report auditory, visual hallucinations & feeling like there were people after her. She was medication non-compliant. After evaluation at the [facility], patient was recommended for inpatient psychiatric hospitalization & was brought to the [facility] for treatment. During this evaluation, patient reports, I walked to the ED by myself on Wednesday morning because I was up for 4 days, no sleep. I have not been on my medicines for about two months. I stopped taking them about two months ago. I stopped it on my own. I think I have bipolar disorder.   During this evaluation, patient presents bizarre, laughing inappropriately while this evaluation was ongoing. She appears as if responding to some internal stimuli. Will say this patient is a poor/fair historian. She is re-started on Abilify  & Sertraline  which were used to stabilize her mood while hospitalized in this [facility] few months ago. When discharged from this [facility] last time, she was recommended to continue mental health care/medication management on an outpatient basis at [facility].   Associated  Signs/Symptoms:  Depression Symptoms:  difficulty concentrating,  (Hypo) Manic Symptoms:  Distractibility, Labiality of Mood,  Anxiety Symptoms:  Excessive Worry,  Psychotic Symptoms:  Paranoia, Patient  PTSD Symptoms: Unable to assess. Patient currently presents bizarre, laughing inappropriately.  Total Time spent with patient: 1.5 hours  Past Psychiatric History: Per chart review.  Previous Psych Diagnoses: Postpartum depression as per patient, and GAD Prior inpatient treatment: Yes, September, 2024 here at [facility]. Current/prior outpatient treatment: Reports a history of going to [facility] in [place], but does not always follow up with treatment in the community. Prior rehab hx: Denies Psychotherapy hx: In the past, but not helpful, wants trauma focused care prior to discharge. History of suicide attempts: Denies History of homicide or aggression: Intense anger outbursts which are recurrent Psychiatric medication history: Prozac in the past as stated to [facility] counselor, unable to recall medications with clinical research associate Psychiatric medication compliance history: Noncompliant historically Neuromodulation history: None Current Psychiatrist: None Current therapist: None  Is the patient at risk to self? Unable to assess, patient is responding to some internal stimuli.  Has the patient been a risk to self in the past 6 months? No.  Has the patient been a risk to self within the distant past? No.  Is the patient a risk to others? No.  Has the patient been a risk to others in the past 6 months? No.  Has the patient been a risk to others within the distant past? No.   [place] Scale:  Flowsheet Row [facility] Most recent reading at 05/22/2024  2:00 AM ED from 05/21/2024 in [facility] Most recent reading at 05/21/2024  6:42 PM ED from  05/21/2024 in [facility] Emergency Department at [facility] Most recent reading at 05/21/2024  4:31 AM  C-SSRS RISK CATEGORY No Risk No Risk No Risk     Prior Inpatient Therapy: Yes.   If yes, describe: [facility] last   Prior Outpatient Therapy: Yes.   If yes, describe: [facility].   Alcohol Screening: 1. How often do you have a drink containing alcohol?: Never 2. How many drinks containing alcohol do you have on a typical day when you are drinking?: 1 or 2 3. How often do you have six or more drinks on one occasion?: Never AUDIT-C Score: 0 4. How often during the last year have you found that you were not able to stop drinking once you had started?: Never 5. How often during the last year have you failed to do what was normally expected from you because of drinking?: Never 6. How often during the last year have you needed a first drink in the morning to get yourself going after a heavy drinking session?: Never 7. How often during the last year have you had a feeling of guilt of remorse after drinking?: Never 8. How often during the last year have you been unable to remember what happened the night before because you had been drinking?: Never 9. Have you or someone else been injured as a result of your drinking?: No 10. Has a relative or friend or a doctor or another health worker been concerned about your drinking or suggested you cut down?: No Alcohol Use Disorder Identification Test Final Score (AUDIT): 0  Substance Abuse History in the last 12 months:  Yes.    Consequences of Substance Abuse: Discussed with patient during this admission evaluation. Medical Consequences:  Liver damage, Possible death by overdose Legal Consequences:  Arrests, jail time, Loss of driving privilege. Family Consequences:  Family discord, divorce and or separation.  Previous Psychotropic Medications: Yes   Psychological Evaluations: Yes   Past Medical History:  Past Medical History:  Diagnosis Date   ADHD (attention deficit hyperactivity disorder)     childhood   Anemia    Bipolar 2 disorder (HCC)    Chest pain    Delta-9-tetrahydrocannabinol (THC) dependence (HCC)    GAD (generalized anxiety disorder)    Insomnia    Nexplanon in place 2018   Psychosis (HCC)    Tobacco dependence     Past Surgical History:  Procedure Laterality Date   TONSILLECTOMY AND ADENOIDECTOMY     Family History:  Family History  Problem Relation Age of Onset   Diabetes Maternal Grandmother    Family Psychiatric  History: Per chart review. Depression: Mother, Including multiple family members Substance use family hx: Multiple family members using THC, cocaine & other substances.  Tobacco Screening: Tobacco Use History[1]  BH Tobacco Counseling     Are you interested in Tobacco Cessation Medications?  No, patient refused Counseled patient on smoking cessation:  Yes Reason Tobacco Screening Not Completed: No value filed.       Social History: Patient reports being single, has one child, lives in [place], [place]. Currently unemployed. Social History   Substance and Sexual Activity  Alcohol Use Not Currently     Social History   Substance and Sexual Activity  Drug Use Yes   Types: Marijuana    Additional Social History:  Allergies:  Allergies[2]  Lab Results:  Results for orders placed or performed during the hospital encounter of 05/21/24 (from the past 48 hours)  CBC with Differential/Platelet  Status: Abnormal   Collection Time: 05/21/24  5:34 PM  Result Value Ref Range   WBC 8.3 4.0 - 10.5 K/uL   RBC 4.89 3.87 - 5.11 MIL/uL   Hemoglobin 13.1 12.0 - 15.0 g/dL   HCT 60.0 63.9 - 53.9 %   MCV 81.6 80.0 - 100.0 fL   MCH 26.8 26.0 - 34.0 pg   MCHC 32.8 30.0 - 36.0 g/dL   RDW 82.7 (H) 88.4 - 84.4 %   Platelets 244 150 - 400 K/uL   nRBC 0.0 0.0 - 0.2 %   Neutrophils Relative % 45 %   Neutro Abs 3.7 1.7 - 7.7 K/uL   Lymphocytes Relative 46 %   Lymphs Abs 3.9 0.7 - 4.0 K/uL   Monocytes Relative 7 %   Monocytes Absolute 0.6 0.1 -  1.0 K/uL   Eosinophils Relative 1 %   Eosinophils Absolute 0.0 0.0 - 0.5 K/uL   Basophils Relative 1 %   Basophils Absolute 0.1 0.0 - 0.1 K/uL   Immature Granulocytes 0 %   Abs Immature Granulocytes 0.02 0.00 - 0.07 K/uL    Comment: Performed at [facility], [address], [place], [place] [address]  Comprehensive metabolic panel     Status: Abnormal   Collection Time: 05/21/24  5:34 PM  Result Value Ref Range   Sodium 141 135 - 145 mmol/L    Comment: Electrolytes repeated to verify    Potassium 3.7 3.5 - 5.1 mmol/L   Chloride 101 98 - 111 mmol/L   CO2 15 (L) 22 - 32 mmol/L   Glucose, Bld 55 (L) 70 - 99 mg/dL    Comment: Glucose reference range applies only to samples taken after fasting for at least 8 hours.   BUN 7 6 - 20 mg/dL   Creatinine, Ser 9.13 0.44 - 1.00 mg/dL   Calcium 89.6 8.9 - 89.6 mg/dL   Total Protein 7.4 6.5 - 8.1 g/dL   Albumin 4.9 3.5 - 5.0 g/dL   AST 16 15 - 41 U/L   ALT 9 0 - 44 U/L   Alkaline Phosphatase 45 38 - 126 U/L   Total Bilirubin 0.4 0.0 - 1.2 mg/dL   GFR, Estimated >39 >39 mL/min    Comment: (NOTE) Calculated using the CKD-EPI Creatinine Equation (2021)    Anion gap 26 (H) 5 - 15    Comment: Performed at [facility], [address], [place], [place] [address]  Hemoglobin A1c     Status: None   Collection Time: 05/21/24  5:34 PM  Result Value Ref Range   Hgb A1c MFr Bld 5.2 4.8 - 5.6 %    Comment: (NOTE) Diagnosis of Diabetes The following HbA1c ranges recommended by the American Diabetes Association (ADA) may be used as an aid in the diagnosis of diabetes mellitus.  Hemoglobin             Suggested A1C NGSP%              Diagnosis  <5.7                   Non Diabetic  5.7-6.4                Pre-Diabetic  >6.4                   Diabetic  <7.0                   Glycemic control  for                       adults with diabetes.     Mean Plasma Glucose 102.54 mg/dL    Comment: Performed at [facility], [address], [place], [place] [address]  Magnesium      Status: None   Collection Time: 05/21/24  5:34 PM  Result Value Ref Range   Magnesium  2.0 1.7 - 2.4 mg/dL    Comment: Performed at [facility], [address], [place], [place] [address]  Ethanol     Status: None   Collection Time: 05/21/24  5:34 PM  Result Value Ref Range   Alcohol, Ethyl (B) <15 <15 mg/dL    Comment: (NOTE) For medical purposes only. Performed at [facility], [address], [place], [place] [address]   Lipid panel     Status: None   Collection Time: 05/21/24  5:34 PM  Result Value Ref Range   Cholesterol 165 0 - 200 mg/dL    Comment:        ATP III CLASSIFICATION:  <200     mg/dL   Desirable  799-760  mg/dL   Borderline High  >=759    mg/dL   High           Triglycerides 72 <150 mg/dL   HDL 59 >59 mg/dL   Total CHOL/HDL Ratio 2.8 RATIO   VLDL 14 0 - 40 mg/dL   LDL Cholesterol 92 0 - 99 mg/dL    Comment:        Total Cholesterol/HDL:CHD Risk Coronary Heart Disease Risk Table                     Men   Women  1/2 Average Risk   3.4   3.3  Average Risk       5.0   4.4  2 X Average Risk   9.6   7.1  3 X Average Risk  23.4   11.0        Use the calculated Patient Ratio above and the CHD Risk Table to determine the patient's CHD Risk.        ATP III CLASSIFICATION (LDL):  <100     mg/dL   Optimal  899-870  mg/dL   Near or Above                    Optimal  130-159  mg/dL   Borderline  839-810  mg/dL   High  >809     mg/dL   Very High Performed at [facility], [address], [place], [place] [address]   TSH     Status: None   Collection Time: 05/21/24  5:34 PM  Result Value Ref Range   TSH 0.657 0.350 - 4.500 uIU/mL    Comment: Performed at [facility], [address], [place], [place] [address]  RPR     Status: None   Collection Time: 05/21/24  5:34 PM  Result Value Ref Range   RPR Ser Ql NON REACTIVE NON REACTIVE    Comment: Performed at [facility], [address], [place], [place] [address]  HIV Antibody (routine testing w rflx)     Status: None   Collection Time: 05/21/24  5:34 PM  Result Value Ref Range   HIV Screen 4th Generation wRfx Non Reactive Non Reactive  Comment: Performed at [facility], [address], [place], [place] [address]  VITAMIN D  25 Hydroxy (Vit-D Deficiency, Fractures)     Status: Abnormal   Collection Time: 05/21/24  5:34 PM  Result Value Ref Range   Vit D, 25-Hydroxy 11.0 (L) 30 - 100 ng/mL    Comment: (NOTE) Vitamin D  deficiency has been defined by the Institute of Medicine  and an Endocrine Society practice guideline as a level of serum 25-OH  vitamin D  less than 20 ng/mL (1,2). The Endocrine Society went on to  further define vitamin D  insufficiency as a level between 21 and 29  ng/mL (2).  1. IOM (Institute of Medicine). 2010. Dietary reference intakes for  calcium and D. [place]: [employer]. 2. Holick MF, Binkley [place], Bischoff-Ferrari HA, et al. Evaluation,  treatment, and prevention of vitamin D  deficiency: an Endocrine  Society clinical practice guideline, JCEM. 2011 Jul; 96(7): 1911-30.  Performed at [facility], [address] [place], [place] [address]   POC urine preg, ED     Status: None   Collection Time: 05/21/24  5:50 PM  Result Value Ref Range   Preg Test, Ur Negative Negative  POCT Urine Drug Screen - (I-Screen)     Status: Abnormal   Collection Time: 05/21/24  5:50 PM  Result Value Ref Range   POC Amphetamine UR None Detected NONE DETECTED (Cut Off Level 1000 ng/mL)   POC Secobarbital (BAR) None Detected NONE DETECTED (Cut Off Level 300 ng/mL)   POC Buprenorphine (BUP) None Detected NONE DETECTED (Cut Off Level 10 ng/mL)   POC Oxazepam (BZO) None Detected NONE DETECTED (Cut Off Level 300 ng/mL)   POC Cocaine UR None Detected NONE DETECTED (Cut Off Level 300 ng/mL)   POC Methamphetamine UR None Detected NONE DETECTED (Cut Off Level 1000 ng/mL)   POC  Morphine None Detected NONE DETECTED (Cut Off Level 300 ng/mL)   POC Methadone UR None Detected NONE DETECTED (Cut Off Level 300 ng/mL)   POC Oxycodone UR None Detected NONE DETECTED (Cut Off Level 100 ng/mL)   POC Marijuana UR Positive (A) NONE DETECTED (Cut Off Level 50 ng/mL)   Blood Alcohol level:  Lab Results  Component Value Date   [facility] <15 05/21/2024   ETH <15 05/21/2024   Metabolic Disorder Labs:  Lab Results  Component Value Date   HGBA1C 5.2 05/21/2024   MPG 102.54 05/21/2024   MPG 108.28 02/25/2023   No results found for: PROLACTIN Lab Results  Component Value Date   CHOL 165 05/21/2024   TRIG 72 05/21/2024   HDL 59 05/21/2024   CHOLHDL 2.8 05/21/2024   VLDL 14 05/21/2024   LDLCALC 92 05/21/2024   LDLCALC 83 02/25/2023   Current Medications: Current Facility-Administered Medications  Medication Dose Route Frequency Provider Last Rate Last Admin   haloperidol  (HALDOL ) tablet 5 mg  5 mg Oral TID PRN Onuoha, Chinwendu V, NP       And   diphenhydrAMINE  (BENADRYL ) capsule 50 mg  50 mg Oral TID PRN Onuoha, Chinwendu V, NP       haloperidol  lactate (HALDOL ) injection 5 mg  5 mg Intramuscular TID PRN Onuoha, Chinwendu V, NP       And   diphenhydrAMINE  (BENADRYL ) injection 50 mg  50 mg Intramuscular TID PRN Onuoha, Chinwendu V, NP       And   LORazepam  (ATIVAN ) injection 2 mg  2 mg Intramuscular TID PRN Onuoha, Chinwendu V, NP  haloperidol  lactate (HALDOL ) injection 10 mg  10 mg Intramuscular TID PRN Onuoha, Chinwendu V, NP       And   diphenhydrAMINE  (BENADRYL ) injection 50 mg  50 mg Intramuscular TID PRN Onuoha, Chinwendu V, NP       And   LORazepam  (ATIVAN ) injection 2 mg  2 mg Intramuscular TID PRN Onuoha, Chinwendu V, NP       nicotine  polacrilex (NICORETTE ) gum 2 mg  2 mg Oral PRN Bouchard, Marc A, DO       PTA Medications: No medications prior to admission.    AIMS:  ,  ,  ,  ,  ,  ,    Musculoskeletal: Strength & Muscle Tone: within normal  limits Gait & Station: normal Patient leans: N/A  Psychiatric Specialty Exam:  Presentation  General Appearance:  Fairly Groomed  Eye Contact: Fair  Speech: Clear and Coherent  Speech Volume: Normal  Handedness: Right   Mood and Affect  Mood: Anxious; Irritable; Depressed  Affect: Congruent   Thought Process  Thought Processes: Coherent  Duration of Psychotic Symptoms:N/A  Past Diagnosis of Schizophrenia or Psychoactive disorder: No data recorded  Descriptions of Associations:Circumstantial  Orientation:Partial  Thought Content:Illogical  Hallucinations:Hallucinations: Auditory; Visual  Ideas of Reference:Paranoia  Suicidal Thoughts:Suicidal Thoughts: No  Homicidal Thoughts:Homicidal Thoughts: No  Sensorium  Memory: Immediate Fair  Judgment: Fair  Insight: Fair  Chartered Certified Accountant: Fair  Attention Span: Fair  Recall: Poor  Fund of Knowledge: Poor  Language: Poor  Psychomotor Activity  Psychomotor Activity: Psychomotor Activity: Normal  Assets  Assets: Resilience  Sleep  Sleep: Sleep: Poor  Estimated Sleeping Duration (Last 24 Hours): 0.00 hours  Physical Exam: Physical Exam Vitals and nursing note reviewed.  HENT:     Head: Normocephalic.     Mouth/Throat:     Pharynx: Oropharynx is clear.  Cardiovascular:     Rate and Rhythm: Normal rate.     Pulses: Normal pulses.  Pulmonary:     Effort: Pulmonary effort is normal.  Genitourinary:    Comments: Allergies: Fish. Musculoskeletal:        General: Normal range of motion.     Cervical back: Normal range of motion.  Skin:    General: Skin is dry.  Neurological:     General: No focal deficit present.     Mental Status: She is oriented to person, place, and time.    Review of Systems  Constitutional:  Negative for chills, diaphoresis and fever.  HENT:  Negative for congestion and sore throat.   Respiratory:  Negative for cough, shortness  of breath and wheezing.   Cardiovascular:  Negative for chest pain and palpitations.  Gastrointestinal:  Negative for abdominal pain, constipation, diarrhea, heartburn, nausea and vomiting.  Musculoskeletal:  Negative for joint pain and myalgias.  Neurological:  Negative for dizziness, tingling, tremors, sensory change, speech change, focal weakness, seizures, loss of consciousness, weakness and headaches.  Endo/Heme/Allergies:        Allergies: Fish.  Psychiatric/Behavioral:  Positive for substance abuse. Negative for suicidal ideas.    Blood pressure 119/74, pulse 65, temperature 97.8 F (36.6 C), temperature source Oral, resp. rate 16, height 5' 6 (1.676 m), weight 61.5 kg, last menstrual period 05/12/2024, SpO2 100%. Body mass index is 21.89 kg/m.  Treatment Plan Summary: Daily contact with patient to assess and evaluate symptoms and progress in treatment and Medication management.   Principal/active diagnoses.  Bipolar affective disorder, mixed, severe, with psychotic behavior (HCC)Generalized anxiety disorder Delta-9-tetrahydrocannabinol (  THC) dependence (HCC)  Plan: The risks/benefits/side-effects/alternatives to the medications in use were discussed in detail with the patient and time was given for patient's questions. The patient consents to medication trial.   Re-initiated: Abilify , Sertraline . -Abilify  5 mg po once today for mood control.  -Abilify  10 mg po daily (start tomorrow 05-23-24) for mood control.  -Initiated Hydroxyzine  25 mg po tid prn for anxiety.   Agitation protocols.  -Continue as recommended.  Other PRNS -Continue Tylenol  650 mg every 6 hours PRN for mild pain -Continue Maalox 30 ml Q 4 hrs PRN for indigestion -Continue MOM 30 ml po Q 6 hrs for constipation  Safety and Monitoring: Voluntary admission to inpatient psychiatric unit for safety, stabilization and treatment Daily contact with patient to assess and evaluate symptoms and progress in  treatment Patient's case to be discussed in multi-disciplinary team meeting Observation Level : q15 minute checks Vital signs: q12 hours Precautions: Safety  Discharge Planning: Social work and case management to assist with discharge planning and identification of hospital follow-up needs prior to discharge Estimated LOS: 5-7 days Discharge Concerns: Need to establish a safety plan; Medication compliance and effectiveness Discharge Goals: Return home with outpatient referrals for mental health follow-up including medication management/psychotherapy  Observation Level/Precautions:  15 minute checks  Laboratory:  Per ED. Current lab results reviewed.  Psychotherapy:  Enrolled in the group sessions.  Medications: See MAR.  Consultations: As needed.  Discharge Concerns: Safety, mood stability.  Estimated LOS: 3-5 days.  Other: NA   Physician Treatment Plan for Primary Diagnosis: Bipolar affective disorder, mixed, severe, with psychotic behavior (HCC) Long Term Goal(s): Improvement in symptoms so as ready for discharge  Short Term Goals: Ability to identify changes in lifestyle to reduce recurrence of condition will improve, Ability to verbalize feelings will improve, Ability to disclose and discuss suicidal ideas, and Ability to demonstrate self-control will improve  Physician Treatment Plan for Secondary Diagnosis: Principal Problem:   Bipolar affective disorder, mixed, severe, with psychotic behavior (HCC) Active Problems:   Generalized anxiety disorder   Delta-9-tetrahydrocannabinol (THC) dependence (HCC)  Long Term Goal(s): Improvement in symptoms so as ready for discharge  Short Term Goals: Ability to identify and develop effective coping behaviors will improve, Ability to maintain clinical measurements within normal limits will improve, Compliance with prescribed medications will improve, and Ability to identify triggers associated with substance abuse/mental health issues will  improve  I certify that inpatient services furnished can reasonably be expected to improve the patient's condition.    Mac Bolster, NP, pmhnp, fnp-bc. 12/17/202511:18 AM      [1]  Social History Tobacco Use  Smoking Status Some Days   Types: Cigarettes  Smokeless Tobacco Never  [2]  Allergies Allergen Reactions   Fish Allergy Itching and Swelling
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
## 2024-05-22 NOTE — Progress Notes (Signed)
(  Sleep Hours) -3.25 as of 0530 (Any PRNs that were needed, meds refused, or side effects to meds)- none (Any disturbances and when (visitation, over night)-none (Concerns raised by the patient)- none (SI/HI/AVH)- denies all

## 2024-05-22 NOTE — Group Note (Signed)
 Recreation Therapy Group Note   Group Topic:Communication  Group Date: 05/22/2024 Start Time: 0935 End Time: 0956 Facilitators: Euclid Cassetta-McCall, LRT,CTRS Location: [address] Dayroom   Group Topic: Communication, Team Building, Problem Solving  Goal Area(s) Addresses:  Patient will effectively work with peer towards shared goal.  Patient will identify skills used to make activity successful.  Patient will identify how skills used during activity can be applied to reach post d/c goals.   Behavioral Response:   Intervention: STEM Activity- Glass Blower/designer  Activity: Tallest [employer]. In teams of 5-6, patients were given 11 craft pipe cleaners. Using the materials provided, patients were instructed to compete again the opposing team(s) to build the tallest free-standing structure from floor level. The activity was timed; difficulty increased by clinical research associate as production designer, theatre/television/film continued.  Systematically resources were removed with additional directions for example, placing one arm behind their back, working in silence, and shape stipulations. LRT facilitated post-activity discussion reviewing team processes and necessary communication skills involved in completion. Patients were encouraged to reflect how the skills utilized, or not utilized, in this activity can be incorporated to positively impact support systems post discharge.  Education: Pharmacist, Community, Scientist, Physiological, Discharge Planning   Education Outcome: Acknowledges education/In group clarification offered/Needs additional education.    Affect/Mood: N/A   Participation Level: Did not attend    Clinical Observations/Individualized Feedback:      Plan: Continue to engage patient in RT group sessions 2-3x/week.   Dewana Ammirati-McCall, LRT,CTRS 05/22/2024 12:28 PM

## 2024-05-23 LAB — PROLACTIN: Prolactin: 15.6 ng/mL (ref 4.8–33.4)

## 2024-05-23 MED ORDER — GABAPENTIN 100 MG PO CAPS
100.0000 mg | ORAL_CAPSULE | Freq: Three times a day (TID) | ORAL | Status: DC
  Administered 2024-05-23 – 2024-05-26 (×8): 100 mg via ORAL
  Filled 2024-05-23 (×8): qty 1

## 2024-05-23 NOTE — Group Note (Signed)
 Date:  05/23/2024 Time:  10:01 AM  Group Topic/Focus: Goals group  Patients were given two worksheets: a goals worksheet and a list of 50 positive traits. Patients participated in an icebreaker by sharing their name, a desired Christmas gift, and identifying positive traits they align with. They were encouraged to share their responses and goals with the group. The group focused on promoting expanded thinking, social interaction, and positivity.    Participation Level:  Active  Participation Quality:  Appropriate, Attentive, and Sharing  Affect:  Appropriate and Flat  Cognitive:  Alert and Appropriate  Insight: Good and Improving  Engagement in Group:  Engaged and Improving  Modes of Intervention:  Discussion, Exploration, Socialization, and Support  Additional Comments:  Pt attended and actively participated in goals group.  Courtney Ashley Courtney Ashley 05/23/2024, 10:01 AM

## 2024-05-23 NOTE — Progress Notes (Signed)
°   05/23/24 0900  Psychosocial Assessment  Patient Complaints Anxiety  Eye Contact Brief  Facial Expression Flat  Affect Flat  Speech Logical/coherent  Interaction Minimal  Motor Activity Other (Comment)  Appearance/Hygiene Unremarkable  Behavior Characteristics Cooperative;Calm  Mood Pleasant  Thought Process  Coherency WDL  Content WDL  Delusions None reported or observed  Perception WDL  Hallucination None reported or observed  Judgment Poor  Confusion None  Danger to Self  Current suicidal ideation? Denies  Agreement Not to Harm Self Yes  Description of Agreement verbal  Danger to Others  Danger to Others None reported or observed

## 2024-05-23 NOTE — Group Note (Signed)
 Date:  05/23/2024 Time:  2:45 PM  Group Topic/Focus: Music therapy  Patients participated in [place], selecting songs of their choice within appropriate therapeutic boundaries. The activity promoted bonding and social connection among participants.    Participation Level:  Did Not Attend  Participation Quality:  N/A  Affect:  N/A  Cognitive:  N/A  Insight: None  Engagement in Group:  N/A  Modes of Intervention:  N/A  Additional Comments:  Pt did not attend this music therapy group  Kristi HERO [facility] 05/23/2024, 2:45 PM

## 2024-05-23 NOTE — Group Note (Signed)
 LCSW Group Therapy Note   Group Date: 05/23/2024 Start Time: 1100 End Time: 1200   Participation:  did not attend  Type of Therapy:  Group Therapy  Topic:  Speaking from the Heart: Communicating with Understanding and Empathy  Objective:  To help participants develop effective communication skills to express themselves clearly, listen actively, and navigate conflicts in a healthy way.  Goals: Increase awareness of verbal and non-verbal communication skills. Practice using I statements and active listening techniques. Learn coping strategies for managing communication stress.  Summary:  Participants explored the importance of communication, discussed challenges, and practiced skills such as active listening and assertive expression. They reflected on past experiences and identified ways to improve communication in their daily lives.  Therapeutic Modalities: -  Cognitive-Behavioral Therapy (CBT): Restructuring negative thought patterns in communication. -  Mindfulness: Staying present and calm during conversations. -  Psychoeducation: Learning about effective communication techniques.   Damari Suastegui O Venancio Chenier, LCSWA 05/23/2024  12:37 PM

## 2024-05-23 NOTE — [facility] Group Notes (Signed)
 Pt did not attend the CSW group today, 05/23/24 (1100-1150)

## 2024-05-23 NOTE — Plan of Care (Signed)
  Problem: Education: Goal: Emotional status will improve Outcome: Progressing Goal: Verbalization of understanding the information provided will improve Outcome: Progressing   Problem: Activity: Goal: Sleeping patterns will improve Outcome: Progressing

## 2024-05-23 NOTE — Progress Notes (Signed)
 [facility] MD Progress Note  05/23/2024 4:37 PM Courtney Ashley  MRN:  [id number]  Reason for admission. 23 year old AA female with prior hx of mental illnesses & substance use disorder (THC). Admitted to this [facility] from the [facility] with complaint of inability to sleep x 4 days. Chart review reports indicated that patient did report auditory, visual hallucinations & feeling like there were people after her. She was medication non-compliant. After evaluation at the [facility], patient was recommended for inpatient psychiatric hospitalization & was brought to the [facility] for treatment. During this evaluation, patient reports, I walked to the ED by myself on Wednesday morning because I was up for 4 days, no sleep. I have not been on my medicines for about two months. I stopped taking them about two months ago. I stopped it on my own.   Daily notes: Courtney Ashley is seen this morning in her room. Chart reviewed. The chart findings discussed with treatment. She presents more alert, oriented & civil today than when she was admitted yesterday. She is making an improved eye contact. Still do laugh inappropriately during this follow-up evaluation, but redirectable. She is verbally responsive making a concrete statements. She reports, I'm okay. I came here to get back on my medicines. I have actually been off all my mental health medications x one year. I'm glad I'm back on my medicines. I have no side effects so far. I have been to one group session & I will try to start going to groups more & more. I will go to the cafeteria for lunch this afternoon. I have no symptoms of depression today, rather, I have high anxiety level. That is the reason I smoked weed. I do not want to continue to smoke weed because of the stuff they dealers are mixing it with these days. Also, I do get very angry a lot. That is part of my challenge. I do not know to not get very angry when something happens. I need some medicine to help me with my anger issues. Courtney Ashley  currently denies any SIHI, AVH, delusional thoughts or paranoia. She does not appear to be responding to any internal stimuli. Patient agrees to try gabapentin  100 mg po tid for anxiety. See the current plan of care below.  Principal Problem: Bipolar affective disorder, mixed, severe, with psychotic behavior (HCC)  Diagnosis: Principal Problem:   Bipolar affective disorder, mixed, severe, with psychotic behavior (HCC) Active Problems:   Generalized anxiety disorder   Delta-9-tetrahydrocannabinol (THC) dependence (HCC)  Total Time spent with patient: 45 minutes  Past Psychiatric History: See H&P.  Past Medical History:  Past Medical History:  Diagnosis Date   ADHD (attention deficit hyperactivity disorder)    childhood   Anemia    Bipolar 2 disorder (HCC)    Chest pain    Delta-9-tetrahydrocannabinol (THC) dependence (HCC)    GAD (generalized anxiety disorder)    Insomnia    Nexplanon in place 2018   Psychosis (HCC)    Tobacco dependence     Past Surgical History:  Procedure Laterality Date   TONSILLECTOMY AND ADENOIDECTOMY     Family History:  Family History  Problem Relation Age of Onset   Diabetes Maternal Grandmother    Family Psychiatric  History: See H&P.  Social History:  Social History   Substance and Sexual Activity  Alcohol Use Not Currently     Social History   Substance and Sexual Activity  Drug Use Yes   Types: Marijuana    Social History  Socioeconomic History   Marital status: Single    Spouse name: Not on file   Number of children: Not on file   Years of education: Not on file   Highest education level: Not on file  Occupational History   Not on file  Tobacco Use   Smoking status: Some Days    Types: Cigarettes   Smokeless tobacco: Never  Vaping Use   Vaping status: Never Used  Substance and Sexual Activity   Alcohol use: Not Currently   Drug use: Yes    Types: Marijuana   Sexual activity: Yes    Birth control/protection:  Implant  Other Topics Concern   Not on file  Social History Narrative   Not on file   Social Drivers of Health   Tobacco Use: High Risk (05/22/2024)   Patient History    Smoking Tobacco Use: Some Days    Smokeless Tobacco Use: Never    Passive Exposure: Not on file  Financial Resource Strain: Not on file  Food Insecurity: No Food Insecurity (05/22/2024)   Epic    Worried About Programme Researcher, Broadcasting/film/video in the Last Year: Never true    Ran Out of Food in the Last Year: Never true  Transportation Needs: No Transportation Needs (05/22/2024)   Epic    Lack of Transportation (Medical): No    Lack of Transportation (Non-Medical): No  Physical Activity: Not on file  Stress: Not on file  Social Connections: Not on file  Depression (EYV7-0): Not on file  Alcohol Screen: Low Risk (05/21/2024)   Alcohol Screen    Last Alcohol Screening Score (AUDIT): 0  Housing: High Risk (05/22/2024)   Epic    Unable to Pay for Housing in the Last Year: No    Number of Times Moved in the Last Year: 2    Homeless in the Last Year: No  Utilities: Not At Risk (05/22/2024)   Epic    Threatened with loss of utilities: No  Health Literacy: Not on file   Additional Social History:   Sleep: Good Estimated Sleeping Duration (Last 24 Hours): 6.50-10.25 hours  Appetite:  Good  Current Medications: Current Facility-Administered Medications  Medication Dose Route Frequency Provider Last Rate Last Admin   ARIPiprazole  (ABILIFY ) tablet 10 mg  10 mg Oral Daily Arick Mareno I, NP   10 mg at 05/23/24 0805   haloperidol  (HALDOL ) tablet 5 mg  5 mg Oral TID PRN Onuoha, Chinwendu V, NP       And   diphenhydrAMINE  (BENADRYL ) capsule 50 mg  50 mg Oral TID PRN Onuoha, Chinwendu V, NP       haloperidol  lactate (HALDOL ) injection 5 mg  5 mg Intramuscular TID PRN Onuoha, Chinwendu V, NP       And   diphenhydrAMINE  (BENADRYL ) injection 50 mg  50 mg Intramuscular TID PRN Onuoha, Chinwendu V, NP       And   LORazepam   (ATIVAN ) injection 2 mg  2 mg Intramuscular TID PRN Onuoha, Chinwendu V, NP       haloperidol  lactate (HALDOL ) injection 10 mg  10 mg Intramuscular TID PRN Onuoha, Chinwendu V, NP       And   diphenhydrAMINE  (BENADRYL ) injection 50 mg  50 mg Intramuscular TID PRN Onuoha, Chinwendu V, NP       And   LORazepam  (ATIVAN ) injection 2 mg  2 mg Intramuscular TID PRN Onuoha, Chinwendu V, NP       gabapentin  (NEURONTIN ) capsule 100 mg  100  mg Oral TID Collene Gouge I, NP       hydrOXYzine  (ATARAX ) tablet 25 mg  25 mg Oral TID PRN Collene Gouge I, NP   25 mg at 05/22/24 2110   nicotine  polacrilex (NICORETTE ) gum 2 mg  2 mg Oral PRN Prentis Kitchens A, DO       sertraline  (ZOLOFT ) tablet 50 mg  50 mg Oral Daily Haddon Fyfe I, NP   50 mg at 05/23/24 0805   traZODone  (DESYREL ) tablet 50 mg  50 mg Oral QHS PRN Collene Gouge I, NP   50 mg at 05/22/24 2110   Lab Results:  Results for orders placed or performed during the hospital encounter of 05/21/24 (from the past 48 hours)  CBC with Differential/Platelet     Status: Abnormal   Collection Time: 05/21/24  5:34 PM  Result Value Ref Range   WBC 8.3 4.0 - 10.5 K/uL   RBC 4.89 3.87 - 5.11 MIL/uL   Hemoglobin 13.1 12.0 - 15.0 g/dL   HCT 60.0 63.9 - 53.9 %   MCV 81.6 80.0 - 100.0 fL   MCH 26.8 26.0 - 34.0 pg   MCHC 32.8 30.0 - 36.0 g/dL   RDW 82.7 (H) 88.4 - 84.4 %   Platelets 244 150 - 400 K/uL   nRBC 0.0 0.0 - 0.2 %   Neutrophils Relative % 45 %   Neutro Abs 3.7 1.7 - 7.7 K/uL   Lymphocytes Relative 46 %   Lymphs Abs 3.9 0.7 - 4.0 K/uL   Monocytes Relative 7 %   Monocytes Absolute 0.6 0.1 - 1.0 K/uL   Eosinophils Relative 1 %   Eosinophils Absolute 0.0 0.0 - 0.5 K/uL   Basophils Relative 1 %   Basophils Absolute 0.1 0.0 - 0.1 K/uL   Immature Granulocytes 0 %   Abs Immature Granulocytes 0.02 0.00 - 0.07 K/uL    Comment: Performed at [facility], [address], [place], [place] [address]  Comprehensive metabolic panel     Status: Abnormal    Collection Time: 05/21/24  5:34 PM  Result Value Ref Range   Sodium 141 135 - 145 mmol/L    Comment: Electrolytes repeated to verify    Potassium 3.7 3.5 - 5.1 mmol/L   Chloride 101 98 - 111 mmol/L   CO2 15 (L) 22 - 32 mmol/L   Glucose, Bld 55 (L) 70 - 99 mg/dL    Comment: Glucose reference range applies only to samples taken after fasting for at least 8 hours.   BUN 7 6 - 20 mg/dL   Creatinine, Ser 9.13 0.44 - 1.00 mg/dL   Calcium 89.6 8.9 - 89.6 mg/dL   Total Protein 7.4 6.5 - 8.1 g/dL   Albumin 4.9 3.5 - 5.0 g/dL   AST 16 15 - 41 U/L   ALT 9 0 - 44 U/L   Alkaline Phosphatase 45 38 - 126 U/L   Total Bilirubin 0.4 0.0 - 1.2 mg/dL   GFR, Estimated >39 >39 mL/min    Comment: (NOTE) Calculated using the CKD-EPI Creatinine Equation (2021)    Anion gap 26 (H) 5 - 15    Comment: Performed at [facility], [address], [place], [place] [address]  Hemoglobin A1c     Status: None   Collection Time: 05/21/24  5:34 PM  Result Value Ref Range   Hgb A1c MFr Bld 5.2 4.8 - 5.6 %    Comment: (NOTE) Diagnosis of Diabetes The following HbA1c ranges  recommended by the American Diabetes Association (ADA) may be used as an aid in the diagnosis of diabetes mellitus.  Hemoglobin             Suggested A1C NGSP%              Diagnosis  <5.7                   Non Diabetic  5.7-6.4                Pre-Diabetic  >6.4                   Diabetic  <7.0                   Glycemic control for                       adults with diabetes.     Mean Plasma Glucose 102.54 mg/dL    Comment: Performed at [facility], [address], [place], [place] [address]  Magnesium      Status: None   Collection Time: 05/21/24  5:34 PM  Result Value Ref Range   Magnesium  2.0 1.7 - 2.4 mg/dL    Comment: Performed at [facility], [address], [place], [place] [address]  Ethanol     Status: None   Collection Time: 05/21/24  5:34 PM  Result Value Ref Range   Alcohol, Ethyl (B) <15 <15 mg/dL     Comment: (NOTE) For medical purposes only. Performed at [facility], [address], [place], [place] [address]   Lipid panel     Status: None   Collection Time: 05/21/24  5:34 PM  Result Value Ref Range   Cholesterol 165 0 - 200 mg/dL    Comment:        ATP III CLASSIFICATION:  <200     mg/dL   Desirable  799-760  mg/dL   Borderline High  >=759    mg/dL   High           Triglycerides 72 <150 mg/dL   HDL 59 >59 mg/dL   Total CHOL/HDL Ratio 2.8 RATIO   VLDL 14 0 - 40 mg/dL   LDL Cholesterol 92 0 - 99 mg/dL    Comment:        Total Cholesterol/HDL:CHD Risk Coronary Heart Disease Risk Table                     Men   Women  1/2 Average Risk   3.4   3.3  Average Risk       5.0   4.4  2 X Average Risk   9.6   7.1  3 X Average Risk  23.4   11.0        Use the calculated Patient Ratio above and the CHD Risk Table to determine the patient's CHD Risk.        ATP III CLASSIFICATION (LDL):  <100     mg/dL   Optimal  899-870  mg/dL   Near or Above                    Optimal  130-159  mg/dL   Borderline  839-810  mg/dL   High  >809     mg/dL   Very High Performed at [facility], [address], [place], [place] [address]  TSH     Status: None   Collection Time: 05/21/24  5:34 PM  Result Value Ref Range   TSH 0.657 0.350 - 4.500 uIU/mL    Comment: Performed at [facility], [address] [place], [place] [address]  Prolactin     Status: None   Collection Time: 05/21/24  5:34 PM  Result Value Ref Range   Prolactin 15.6 4.8 - 33.4 ng/mL    Comment: (NOTE) Performed At: [facility] [address] [place], [place] [id number] Jennette Shorter MD Ey:[id number]   RPR     Status: None   Collection Time: 05/21/24  5:34 PM  Result Value Ref Range   RPR Ser Ql NON REACTIVE NON REACTIVE    Comment: Performed at [facility], [address] [place], [place] [address]  HIV Antibody (routine testing w rflx)     Status: None   Collection Time:  05/21/24  5:34 PM  Result Value Ref Range   HIV Screen 4th Generation wRfx Non Reactive Non Reactive    Comment: Performed at [facility], [address] [place], [place] [address]  VITAMIN D  25 Hydroxy (Vit-D Deficiency, Fractures)     Status: Abnormal   Collection Time: 05/21/24  5:34 PM  Result Value Ref Range   Vit D, 25-Hydroxy 11.0 (L) 30 - 100 ng/mL    Comment: (NOTE) Vitamin D  deficiency has been defined by the Institute of Medicine  and an Endocrine Society practice guideline as a level of serum 25-OH  vitamin D  less than 20 ng/mL (1,2). The Endocrine Society went on to  further define vitamin D  insufficiency as a level between 21 and 29  ng/mL (2).  1. IOM (Institute of Medicine). 2010. Dietary reference intakes for  calcium and D. [place]: [employer]. 2. Holick MF, Binkley Lee Mont, Bischoff-Ferrari HA, et al. Evaluation,  treatment, and prevention of vitamin D  deficiency: an Endocrine  Society clinical practice guideline, JCEM. 2011 Jul; 96(7): 1911-30.  Performed at [facility], [address], [place], [place] [address]   POC urine preg, ED     Status: None   Collection Time: 05/21/24  5:50 PM  Result Value Ref Range   Preg Test, Ur Negative Negative  POCT Urine Drug Screen - (I-Screen)     Status: Abnormal   Collection Time: 05/21/24  5:50 PM  Result Value Ref Range   POC Amphetamine UR None Detected NONE DETECTED (Cut Off Level 1000 ng/mL)   POC Secobarbital (BAR) None Detected NONE DETECTED (Cut Off Level 300 ng/mL)   POC Buprenorphine (BUP) None Detected NONE DETECTED (Cut Off Level 10 ng/mL)   POC Oxazepam (BZO) None Detected NONE DETECTED (Cut Off Level 300 ng/mL)   POC Cocaine UR None Detected NONE DETECTED (Cut Off Level 300 ng/mL)   POC Methamphetamine UR None Detected NONE DETECTED (Cut Off Level 1000 ng/mL)   POC Morphine None Detected NONE DETECTED (Cut Off Level 300 ng/mL)   POC Methadone UR None Detected NONE DETECTED  (Cut Off Level 300 ng/mL)   POC Oxycodone UR None Detected NONE DETECTED (Cut Off Level 100 ng/mL)   POC Marijuana UR Positive (A) NONE DETECTED (Cut Off Level 50 ng/mL)   Blood Alcohol level:  Lab Results  Component Value Date   [facility] <15 05/21/2024   ETH <15 05/21/2024   Metabolic Disorder Labs: Lab Results  Component Value Date   HGBA1C 5.2 05/21/2024   MPG 102.54 05/21/2024  MPG 108.28 02/25/2023   Lab Results  Component Value Date   PROLACTIN 15.6 05/21/2024   Lab Results  Component Value Date   CHOL 165 05/21/2024   TRIG 72 05/21/2024   HDL 59 05/21/2024   CHOLHDL 2.8 05/21/2024   VLDL 14 05/21/2024   LDLCALC 92 05/21/2024   LDLCALC 83 02/25/2023   Physical Findings: AIMS:  ,  ,  ,  ,  ,  ,   CIWA:    COWS:     Musculoskeletal: Strength & Muscle Tone: within normal limits Gait & Station: normal Patient leans: N/A  Psychiatric Specialty Exam:  Presentation  General Appearance:  Casual; Fairly Groomed  Eye Contact: Good  Speech: Clear and Coherent  Speech Volume: Normal  Handedness: Right  Mood and Affect  Mood: -- (Some improvement notes.)  Affect: Congruent  Thought Process  Thought Processes: Coherent; Goal Directed; Linear  Descriptions of Associations:Intact  Orientation:Full (Time, Place and Person)  Thought Content:Logical  History of Schizophrenia/Schizoaffective disorder:No data recorded Duration of Psychotic Symptoms:No data recorded Hallucinations:Hallucinations: None  Ideas of Reference:None  Suicidal Thoughts:Suicidal Thoughts: No  Homicidal Thoughts:Homicidal Thoughts: No   Sensorium  Memory: Immediate Fair; Recent Good; Immediate Good; Remote Fair  Judgment: -- (improved some.)  Insight: Present   Executive Functions  Concentration: Good  Attention Span: Good  Recall: Fair  Fund of Knowledge: Fair  Language: Good  Psychomotor Activity  Psychomotor Activity:Psychomotor Activity:  Normal  Assets  Assets: Communication Skills; Desire for Improvement; Housing; Physical Health; Resilience; Social Support  Sleep  Sleep:Sleep: Good Number of Hours of Sleep: 9.5  Physical Exam: Physical Exam Vitals and nursing note reviewed.  HENT:     Head: Normocephalic.     Nose: Nose normal.  Cardiovascular:     Rate and Rhythm: Normal rate.     Pulses: Normal pulses.  Pulmonary:     Effort: Pulmonary effort is normal.  Genitourinary:    Comments: Deferred. Musculoskeletal:        General: Normal range of motion.     Cervical back: Normal range of motion.  Skin:    General: Skin is dry.  Neurological:     General: No focal deficit present.     Mental Status: She is alert and oriented to person, place, and time.    Review of Systems  Constitutional:  Negative for chills, diaphoresis and fever.  HENT:  Negative for congestion and sore throat.   Eyes:  Negative for blurred vision.  Respiratory:  Negative for cough, shortness of breath and wheezing.   Cardiovascular:  Negative for chest pain and palpitations.  Neurological:  Negative for dizziness, tingling, tremors, sensory change, speech change, focal weakness, seizures, loss of consciousness, weakness and headaches.  Psychiatric/Behavioral:  Positive for depression, hallucinations and substance abuse. Negative for memory loss. The patient is not nervous/anxious and does not have insomnia.    Blood pressure 121/73, pulse 81, temperature 99 F (37.2 C), temperature source Oral, resp. rate 18, height 5' 6 (1.676 m), weight 61.5 kg, last menstrual period 05/12/2024, SpO2 100%. Body mass index is 21.89 kg/m.  Treatment Plan Summary: Daily contact with patient to assess and evaluate symptoms and progress in treatment and Medication management.      Principal/active diagnoses.  Bipolar affective disorder, mixed, severe, with psychotic behavior (HCC)Generalized anxiety disorder Delta-9-tetrahydrocannabinol (THC)  dependence (HCC)  Plan: The risks/benefits/side-effects/alternatives to the medications in use were discussed in detail with the patient and time was given for patient's questions. The patient  consents to medication trial.    Re-initiated: Abilify , Sertraline . -Completed Abilify  5 mg po once today for mood control.  -Continue Abilify  10 mg po daily (start tomorrow 05-23-24) for mood control.  -Continue Hydroxyzine  25 mg po tid prn for anxiety.  -Continue Sertraline  50 mg po daily for depression.  -Initiated gabapentin  100 mg po tid for anxiety.   Agitation protocols.  -Continue as recommended.   Other PRNS -Continue Tylenol  650 mg every 6 hours PRN for mild pain -Continue Maalox 30 ml Q 4 hrs PRN for indigestion -Continue MOM 30 ml po Q 6 hrs for constipation   Safety and Monitoring: Voluntary admission to inpatient psychiatric unit for safety, stabilization and treatment Daily contact with patient to assess and evaluate symptoms and progress in treatment Patient's case to be discussed in multi-disciplinary team meeting Observation Level : q15 minute checks Vital signs: q12 hours Precautions: Safety   Discharge Planning: Social work and case management to assist with discharge planning and identification of hospital follow-up needs prior to discharge Estimated LOS: 5-7 days Discharge Concerns: Need to establish a safety plan; Medication compliance and effectiveness Discharge Goals: Return home with outpatient referrals for mental health follow-up including medication management/psychotherapy     Mac Bolster, NP, pmhnp, fnp-bc. 05/23/2024, 4:37 PM

## 2024-05-23 NOTE — [facility] Group Notes (Signed)
 Pt did not attend the nutrition group today, 05/23/24 ([id number])

## 2024-05-23 NOTE — [facility] Group Notes (Signed)
 Pt did not attend the OT group today, 05/23/24 ([id number])

## 2024-05-23 NOTE — Progress Notes (Signed)
(  Sleep Hours) -9.75 as of 0530 (Any PRNs that were needed, meds refused, or side effects to meds)- prn trazodone  and hydroxyzine  @ 2110 (Any disturbances and when (visitation, over night)-none (Concerns raised by the patient)- none (SI/HI/AVH)- denies all

## 2024-05-23 NOTE — Group Note (Signed)
 Date:  05/23/2024 Time:  9:29 PM  Group Topic/Focus:  Wrap-Up Group:   The focus of this group is to help patients review their daily goal of treatment and discuss progress on daily workbooks.    Participation Level:  Did Not Attend  Participation Quality:  Patient did not attend group!  Affect:  Patient did not attend group!  Cognitive:  Patient did not attend group!  Insight: Patient did not attend group!  Engagement in Group:  Patient did not attend group!  Modes of Intervention:  Patient did not attend group!  Additional Comments:  Patient did not attend group! MHT will continue to encourage active participation.  Dena JINNY Mace 05/23/2024, 9:29 PM

## 2024-05-24 DIAGNOSIS — F1220 Cannabis dependence, uncomplicated: Secondary | ICD-10-CM

## 2024-05-24 DIAGNOSIS — F3164 Bipolar disorder, current episode mixed, severe, with psychotic features: Principal | ICD-10-CM

## 2024-05-24 DIAGNOSIS — F411 Generalized anxiety disorder: Secondary | ICD-10-CM

## 2024-05-24 MED ORDER — BISACODYL 5 MG PO TBEC
10.0000 mg | DELAYED_RELEASE_TABLET | Freq: Once | ORAL | Status: AC
  Administered 2024-05-24: 10 mg via ORAL
  Filled 2024-05-24: qty 2

## 2024-05-24 MED ORDER — VITAMIN D (ERGOCALCIFEROL) 1.25 MG (50000 UNIT) PO CAPS
50000.0000 [IU] | ORAL_CAPSULE | ORAL | Status: DC
  Administered 2024-05-25: 50000 [IU] via ORAL
  Filled 2024-05-24: qty 1

## 2024-05-24 MED ORDER — SENNOSIDES-DOCUSATE SODIUM 8.6-50 MG PO TABS
1.0000 | ORAL_TABLET | Freq: Once | ORAL | Status: AC
  Administered 2024-05-24: 1 via ORAL
  Filled 2024-05-24: qty 1

## 2024-05-24 MED ORDER — DOCUSATE SODIUM 100 MG PO CAPS
100.0000 mg | ORAL_CAPSULE | Freq: Every day | ORAL | Status: DC
  Administered 2024-05-24 – 2024-05-26 (×3): 100 mg via ORAL
  Filled 2024-05-24 (×3): qty 1

## 2024-05-24 NOTE — Group Note (Signed)
 Date:  05/24/2024 Time:  9:55 AM  Group Topic/Focus:  RECREATION THERAPY (KEEP IT GOING VOLLYBALL) Purpose: The Keep It Going Volleyball session was organized as a recreational group activity aimed at promoting physical movement, social interaction, and team-building in a supportive and structured environment. The activity serves as a tool to engage participants in an enjoyable and non-competitive setting, encouraging positive mental health through physical exercise, communication, and group participation.  Objectives:  Enhance Socialization: Provide a platform for participants to engage with peers in a relaxed and enjoyable setting, promoting healthy interpersonal interactions.  Promote Physical Activity: Encourage movement and physical exercise, which has been shown to help improve mood, reduce stress, and increase overall well-being.  Build Teamwork and Cooperation: Jerrye a agricultural engineer and cooperation among participants, helping them work together toward a common goal in a non-competitive manner.  Boost Mental Well-Being: Provide a low-pressure environment where participants can engage in fun and rewarding activity, which contributes to reducing anxiety and improving self-esteem.  Increase Engagement: Create an opportunity for participants to be actively engaged and present, reducing feelings of isolation and promoting a sense of belonging.  Activity Overview: The session involved a relaxed, non-competitive game of volleyball where participants worked together in teams. The focus was on having fun, training and development officer, and encouraging active participation. Rules were kept simple to ensure that everyone could take part regardless of skill level.  Facilitators provided gentle guidance and encouragement, ensuring that the activity remained inclusive and supportive. Emphasis was placed on fostering a positive atmosphere, with participants encouraged to celebrate each others efforts and  accomplishments, no matter how small.  Outcome/Results:  Participants showed increased engagement and enthusiasm throughout the activity.  Positive interactions were observed between participants, with moments of laughter, encouragement, and team bonding.  Physical activity appeared to contribute to an improvement in overall mood and energy levels.  Several participants expressed enjoyment and a desire to continue participating in future recreational activities.  Recommendations:  Continue incorporating recreational activities like volleyball into the schedule to promote group cohesion and mental well-being.  Explore further team-based or movement-focused activities to enhance social skills and emotional regulation.    Participation Level:  Active  Noel Rodier 05/24/2024, 9:55 AM

## 2024-05-24 NOTE — Group Note (Signed)
 Recreation Therapy Group Note   Group Topic:Leisure Education  Group Date: 05/24/2024 Start Time: 0930 End Time: 1000 Facilitators: Kellyanne Ellwanger-McCall, LRT,CTRS Location: [address] Dayroom   Group Topic: Leisure Education   Goal Area(s) Addresses:  Patient will successfully identify positive leisure and recreation activities.  Patient will acknowledge benefits of participation in healthy leisure activities post discharge.  Patient will actively work with peers toward a shared goal.   Behavioral Response: Engaged    Intervention: Cooperative Group Game    Activity: Keep It Contractor. In circle, patients were timed as they tossed a beach ball to each to each other to determine how long they could keep the ball in motion. If the ball came to a complete stop, the time would reset and the game started over. Patients were trying to beat the longest time of previous groups.    Education: Teacher, English As A Foreign Language, Leisure as Merchant [employer] Officer, Programmer, Applications, Building Control Surveyor   Education Outcome: Acknowledges education/In group clarification offered/Needs additional education   Affect/Mood: Appropriate   Participation Level: Engaged   Participation Quality: Independent   Behavior: Appropriate   Speech/Thought Process: Focused   Insight: Good   Judgement: Good   Modes of Intervention: Cooperative Play   Patient Response to Interventions:  Engaged   Education Outcome:  In group clarification offered    Clinical Observations/Individualized Feedback: Pt was engaged and focused during activity. Pt was attentive, bright and social with peers.   Plan: Continue to engage patient in RT group sessions 2-3x/week.   Juanpablo Ciresi-McCall, LRT,CTRS  05/24/2024 12:21 PM

## 2024-05-24 NOTE — Progress Notes (Signed)
 [facility] MD Progress Note  05/24/2024 2:13 PM Courtney Ashley  MRN:  [id number]  Reason for admission. 23 year old AA female with prior hx of mental illnesses & substance use disorder (THC). Admitted to this [facility] from the [facility] with complaint of inability to sleep x 4 days. Chart review reports indicated that patient did report auditory, visual hallucinations & feeling like there were people after her. She was medication non-compliant. After evaluation at the [facility], patient was recommended for inpatient psychiatric hospitalization & was brought to the [facility] for treatment. During this evaluation, patient reports, I walked to the ED by myself on Wednesday morning because I was up for 4 days, no sleep. I have not been on my medicines for about two months. I stopped taking them about two months ago. I stopped it on my own.   Daily notes: Courtney Ashley is seen this morning outside her room. Chart reviewed. The chart findings discussed with treatment. She presents with a much improved alertness/affect. She is oriented x 4. She is making a good eye contact & verbally responsive. She reports, My mood is better. I think my medicines are helping. The reason I came to the hospital is because I need to get back on my medicines. I'm on them now, doing well on them too. That was what my grandmother wanted for me to get back on my medicines. I feel like I'm better & ready to go home. I know staying on my medicines will help me to not smoke weed. The only complaint I have today to give me something for constipation. Courtney Ashley currently denies any SIHI, AVH, delusional thoughts or paranoia. She does not appear to be responding to any internal stimuli. Patient agreed to try gabapentin  100 mg po tid for anxiety yesterday. She denies any side effects. See the current plan of care below.  Principal Problem: Bipolar affective disorder, mixed, severe, with psychotic behavior (HCC)  Diagnosis: Principal Problem:   Bipolar affective disorder, mixed,  severe, with psychotic behavior (HCC) Active Problems:   Generalized anxiety disorder   Delta-9-tetrahydrocannabinol (THC) dependence (HCC)  Total Time spent with patient: 45 minutes  Past Psychiatric History: See H&P.  Past Medical History:  Past Medical History:  Diagnosis Date   ADHD (attention deficit hyperactivity disorder)    childhood   Anemia    Bipolar 2 disorder (HCC)    Chest pain    Delta-9-tetrahydrocannabinol (THC) dependence (HCC)    GAD (generalized anxiety disorder)    Insomnia    Nexplanon in place 2018   Psychosis (HCC)    Tobacco dependence     Past Surgical History:  Procedure Laterality Date   TONSILLECTOMY AND ADENOIDECTOMY     Family History:  Family History  Problem Relation Age of Onset   Diabetes Maternal Grandmother    Family Psychiatric  History: See H&P.  Social History:  Social History   Substance and Sexual Activity  Alcohol Use Not Currently     Social History   Substance and Sexual Activity  Drug Use Yes   Types: Marijuana    Social History   Socioeconomic History   Marital status: Single    Spouse name: Not on file   Number of children: Not on file   Years of education: Not on file   Highest education level: Not on file  Occupational History   Not on file  Tobacco Use   Smoking status: Some Days    Types: Cigarettes   Smokeless tobacco: Never  Vaping Use  Vaping status: Never Used  Substance and Sexual Activity   Alcohol use: Not Currently   Drug use: Yes    Types: Marijuana   Sexual activity: Yes    Birth control/protection: Implant  Other Topics Concern   Not on file  Social History Narrative   Not on file   Social Drivers of Health   Tobacco Use: High Risk (05/22/2024)   Patient History    Smoking Tobacco Use: Some Days    Smokeless Tobacco Use: Never    Passive Exposure: Not on file  Financial Resource Strain: Not on file  Food Insecurity: No Food Insecurity (05/22/2024)   Epic    Worried About  Programme Researcher, Broadcasting/film/video in the Last Year: Never true    Ran Out of Food in the Last Year: Never true  Transportation Needs: No Transportation Needs (05/22/2024)   Epic    Lack of Transportation (Medical): No    Lack of Transportation (Non-Medical): No  Physical Activity: Not on file  Stress: Not on file  Social Connections: Not on file  Depression (EYV7-0): Not on file  Alcohol Screen: Low Risk (05/21/2024)   Alcohol Screen    Last Alcohol Screening Score (AUDIT): 0  Housing: High Risk (05/22/2024)   Epic    Unable to Pay for Housing in the Last Year: No    Number of Times Moved in the Last Year: 2    Homeless in the Last Year: No  Utilities: Not At Risk (05/22/2024)   Epic    Threatened with loss of utilities: No  Health Literacy: Not on file   Additional Social History:   Sleep: Good Estimated Sleeping Duration (Last 24 Hours): 7.25-8.75 hours  Appetite:  Good  Current Medications: Current Facility-Administered Medications  Medication Dose Route Frequency Provider Last Rate Last Admin   ARIPiprazole  (ABILIFY ) tablet 10 mg  10 mg Oral Daily Hakim Minniefield I, NP   10 mg at 05/24/24 0849   bisacodyl (DULCOLAX) EC tablet 10 mg  10 mg Oral Once Joya Willmott I, NP       haloperidol  (HALDOL ) tablet 5 mg  5 mg Oral TID PRN Onuoha, Chinwendu V, NP       And   diphenhydrAMINE  (BENADRYL ) capsule 50 mg  50 mg Oral TID PRN Onuoha, Chinwendu V, NP       haloperidol  lactate (HALDOL ) injection 5 mg  5 mg Intramuscular TID PRN Onuoha, Chinwendu V, NP       And   diphenhydrAMINE  (BENADRYL ) injection 50 mg  50 mg Intramuscular TID PRN Onuoha, Chinwendu V, NP       And   LORazepam  (ATIVAN ) injection 2 mg  2 mg Intramuscular TID PRN Onuoha, Chinwendu V, NP       haloperidol  lactate (HALDOL ) injection 10 mg  10 mg Intramuscular TID PRN Onuoha, Chinwendu V, NP       And   diphenhydrAMINE  (BENADRYL ) injection 50 mg  50 mg Intramuscular TID PRN Onuoha, Chinwendu V, NP       And   LORazepam   (ATIVAN ) injection 2 mg  2 mg Intramuscular TID PRN Onuoha, Chinwendu V, NP       docusate sodium (COLACE) capsule 100 mg  100 mg Oral Daily Jamonta Goerner I, NP       gabapentin  (NEURONTIN ) capsule 100 mg  100 mg Oral TID Tniya Bowditch I, NP   100 mg at 05/24/24 0849   hydrOXYzine  (ATARAX ) tablet 25 mg  25 mg Oral TID PRN Collene Gouge  I, NP   25 mg at 05/23/24 2108   nicotine  polacrilex (NICORETTE ) gum 2 mg  2 mg Oral PRN Prentis Kitchens A, DO       sertraline  (ZOLOFT ) tablet 50 mg  50 mg Oral Daily Khai Torbert I, NP   50 mg at 05/24/24 9150   traZODone  (DESYREL ) tablet 50 mg  50 mg Oral QHS PRN Collene Gouge I, NP   50 mg at 05/23/24 2108   Lab Results:  No results found for this or any previous visit (from the past 48 hours).  Blood Alcohol level:  Lab Results  Component Value Date   [facility] <15 05/21/2024   ETH <15 05/21/2024   Metabolic Disorder Labs: Lab Results  Component Value Date   HGBA1C 5.2 05/21/2024   MPG 102.54 05/21/2024   MPG 108.28 02/25/2023   Lab Results  Component Value Date   PROLACTIN 15.6 05/21/2024   Lab Results  Component Value Date   CHOL 165 05/21/2024   TRIG 72 05/21/2024   HDL 59 05/21/2024   CHOLHDL 2.8 05/21/2024   VLDL 14 05/21/2024   LDLCALC 92 05/21/2024   LDLCALC 83 02/25/2023   Physical Findings: AIMS:  ,  ,  ,  ,  ,  ,   CIWA:    COWS:     Musculoskeletal: Strength & Muscle Tone: within normal limits Gait & Station: normal Patient leans: N/A  Psychiatric Specialty Exam:  Presentation  General Appearance:  Casual; Fairly Groomed  Eye Contact: Good  Speech: Clear and Coherent  Speech Volume: Normal  Handedness: Right  Mood and Affect  Mood: -- (Some improvement notes.)  Affect: Congruent  Thought Process  Thought Processes: Coherent; Goal Directed; Linear  Descriptions of Associations:Intact  Orientation:Full (Time, Place and Person)  Thought Content:Logical  History of Schizophrenia/Schizoaffective  disorder: NA  Duration of Psychotic Symptoms: NA  Hallucinations:Hallucinations: None  Ideas of Reference:None  Suicidal Thoughts:Suicidal Thoughts: No  Homicidal Thoughts:Homicidal Thoughts: No   Sensorium  Memory: Immediate Fair; Recent Good; Immediate Good; Remote Fair  Judgment: -- (improved some.)  Insight: Present   Executive Functions  Concentration: Good  Attention Span: Good  Recall: Fair  Fund of Knowledge: Fair  Language: Good  Psychomotor Activity  Psychomotor Activity:Psychomotor Activity: Normal  Assets  Assets: Communication Skills; Desire for Improvement; Housing; Physical Health; Resilience; Social Support  Sleep  Sleep:Sleep: Good Number of Hours of Sleep: 9.5  Physical Exam: Physical Exam Vitals and nursing note reviewed.  HENT:     Head: Normocephalic.     Nose: Nose normal.  Cardiovascular:     Rate and Rhythm: Normal rate.     Pulses: Normal pulses.  Pulmonary:     Effort: Pulmonary effort is normal.  Genitourinary:    Comments: Deferred. Musculoskeletal:        General: Normal range of motion.     Cervical back: Normal range of motion.  Skin:    General: Skin is dry.  Neurological:     General: No focal deficit present.     Mental Status: She is alert and oriented to person, place, and time.    Review of Systems  Constitutional:  Negative for chills, diaphoresis and fever.  HENT:  Negative for congestion and sore throat.   Eyes:  Negative for blurred vision.  Respiratory:  Negative for cough, shortness of breath and wheezing.   Cardiovascular:  Negative for chest pain and palpitations.  Neurological:  Negative for dizziness, tingling, tremors, sensory change, speech change, focal  weakness, seizures, loss of consciousness, weakness and headaches.  Psychiatric/Behavioral:  Positive for depression, hallucinations and substance abuse. Negative for memory loss. The patient is not nervous/anxious and does not have  insomnia.    Blood pressure 134/80, pulse 61, temperature 98.9 F (37.2 C), temperature source Oral, resp. rate 16, height 5' 6 (1.676 m), weight 61.5 kg, last menstrual period 05/12/2024, SpO2 100%. Body mass index is 21.89 kg/m.  Treatment Plan Summary: Daily contact with patient to assess and evaluate symptoms and progress in treatment and Medication management.    Principal/active diagnoses.  Bipolar affective disorder, mixed, severe, with psychotic behavior (HCC)Generalized anxiety disorder Delta-9-tetrahydrocannabinol (THC) dependence (HCC)  Plan: The risks/benefits/side-effects/alternatives to the medications in use were discussed in detail with the patient and time was given for patient's questions. The patient consents to medication trial.    Re-initiated: Abilify , Sertraline . -Completed Abilify  5 mg po once today for mood control.  -Continue Abilify  10 mg po daily (start tomorrow 05-23-24) for mood control.  -Continue Hydroxyzine  25 mg po tid prn for anxiety.  -Continue Sertraline  50 mg po daily for depression.  -Continue gabapentin  100 mg po tid for anxiety.  -Dulcolax 10 mg po once for constipation.  -Initiated Colace 100 mg po daily for constipation.   Agitation protocols.  -Continue as recommended.   Other PRNS -Continue Tylenol  650 mg every 6 hours PRN for mild pain -Continue Maalox 30 ml Q 4 hrs PRN for indigestion -Continue MOM 30 ml po Q 6 hrs for constipation   Safety and Monitoring: Voluntary admission to inpatient psychiatric unit for safety, stabilization and treatment Daily contact with patient to assess and evaluate symptoms and progress in treatment Patient's case to be discussed in multi-disciplinary team meeting Observation Level : q15 minute checks Vital signs: q12 hours Precautions: Safety   Discharge Planning: Social work and case management to assist with discharge planning and identification of hospital follow-up needs prior to  discharge Estimated LOS: 5-7 days Discharge Concerns: Need to establish a safety plan; Medication compliance and effectiveness Discharge Goals: Return home with outpatient referrals for mental health follow-up including medication management/psychotherapy     Mac Bolster, NP, pmhnp, fnp-bc. 05/24/2024, 2:13 PM Patient ID: Courtney Ashley, female   DOB: [birth year], 23 y.o.   MRN: [id number]

## 2024-05-24 NOTE — Plan of Care (Signed)
  Problem: Education: Goal: Emotional status will improve Outcome: Progressing Goal: Verbalization of understanding the information provided will improve Outcome: Progressing   Problem: Activity: Goal: Interest or engagement in activities will improve Outcome: Progressing Goal: Sleeping patterns will improve Outcome: Progressing

## 2024-05-24 NOTE — Plan of Care (Signed)
   Problem: Education: Goal: Knowledge of [facility] Education information/materials will improve Outcome: Progressing Goal: Emotional status will improve Outcome: Progressing Goal: Mental status will improve Outcome: Progressing

## 2024-05-24 NOTE — Progress Notes (Incomplete)
(  Sleep Hours) -11  (Any PRNs that were needed, meds refused, or side effects to meds)- hydroxyzine  25mg ; Trazodone  50mg   (Any disturbances and when (visitation, over night)-none  (Concerns raised by the patient)- none  (SI/HI/AVH)-denies

## 2024-05-24 NOTE — Progress Notes (Signed)
" °   05/24/24 0900  Psych Admission Type (Psych Patients Only)  Admission Status Voluntary  Psychosocial Assessment  Patient Complaints None  Eye Contact Brief  Facial Expression Flat  Affect Flat  Speech Logical/coherent  Interaction Minimal  Motor Activity Other (Comment) (WNL)  Appearance/Hygiene Unremarkable  Behavior Characteristics Cooperative;Calm  Mood Pleasant  Thought Process  Coherency WDL  Content WDL  Delusions None reported or observed  Perception WDL  Hallucination None reported or observed  Judgment Poor  Confusion None  Danger to Self  Current suicidal ideation? Denies  Agreement Not to Harm Self Yes  Description of Agreement verbal  Danger to Others  Danger to Others None reported or observed    "

## 2024-05-24 NOTE — Group Note (Signed)
 Date:  05/24/2024 Time:  8:33 PM  Group Topic/Focus:  Neurotransmitters and their role in moods and behaviors. How psych meds affect them and for which symptoms. The gut flora's role with neurotransmitters.    Participation Level:  Did Not Attend   Courtney Ashley 05/24/2024, 8:33 PM

## 2024-05-24 NOTE — Plan of Care (Signed)
  Problem: Education: Goal: Mental status will improve Outcome: Progressing   

## 2024-05-24 NOTE — Group Note (Signed)
 Date:  05/24/2024 Time:  9:32 AM  Group Topic/Focus:  Goals Group:   The focus of this group is to help patients establish daily goals to achieve during treatment and discuss how the patient can incorporate goal setting into their daily lives to aide in recovery. Orientation:   The focus of this group is to educate the patient on the purpose and policies of crisis stabilization and provide a format to answer questions about their admission.  The group details unit policies and expectations of patients while admitted.    Participation Level:  Did Not Attend    Jotham Ahn 05/24/2024, 9:32 AM

## 2024-05-24 NOTE — Progress Notes (Signed)
(  Sleep Hours) -10 (Any PRNs that were needed, meds refused, or side effects to meds)-Trazodone  and Atarax   (Any disturbances and when (visitation, over night)-none (Concerns raised by the patient)- none (SI/HI/AVH)-denied

## 2024-05-24 NOTE — Group Note (Signed)
 Date:  05/24/2024 Time:  12:00 PM  Group Topic/Focus:  Group Topic/Focus:  Description: The Social Wellness group focused on identifying and creating healthy personal boundaries. Patients were educated on the purpose of boundaries, different types of boundaries (emotional, physical, verbal), and the importance of boundaries in maintaining healthy relationships and personal well-being. The group discussed examples of healthy versus unhealthy boundaries and practiced assertive communication through discussion and scenario-based examples. Patients were encouraged to reflect on their own boundary challenges and ways to set limits respectfully and safely.  Patient Participation/Response: Patients were attentive and engaged throughout the group. Several patients actively participated by sharing personal examples, writing on the white bored and asking questions. Overall participation was appropriate, and patients demonstrated understanding of the topic through discussion and responses.    Participation Level:  Active  Participation Quality:  Appropriate  Affect:  Appropriate  Cognitive:  Appropriate  Insight: Appropriate  Engagement in Group:  Engaged  Modes of Intervention:  Activity  Additional Comments:  n/a  Ryne Mctigue 05/24/2024, 12:00 PM

## 2024-05-25 NOTE — Group Note (Signed)
 Date:  05/25/2024 Time:  1:25 PM  Group Topic/Focus: Physical wellness  This physical wellness group focused on Zumba and music-based movement using YouTube videos. Patients were encouraged to engage in dancing with peers as a fun, accessible way to increase physical activity and promote circulation as an alternative to gym-based exercise.    Participation Level:  Did Not Attend  Participation Quality:  N/A  Affect:  N/A  Cognitive:  N/A  Insight: None  Engagement in Group:  None  Modes of Intervention:  N/A  Additional Comments:  Pt did not attend this group  Kristi HERO Revel Stellmach 05/25/2024, 1:25 PM

## 2024-05-25 NOTE — Group Note (Signed)
 Date:  05/25/2024 Time:  4:35 PM  Group Topic/Focus: Karaoke and bingo  Patients participated in a 20-minute karaoke session, which provided an opportunity for them to express their creativity, build confidence, and offer support to each other. Following the karaoke, the group spent the remainder of the hour playing bingo with this MHT. The activity fostered engagement and social interaction among the patients. Winners of [employer] game were rewarded with an extra snack as a positive reinforcement.    Participation Level:  Did Not Attend  Participation Quality:  N/A  Affect:  N/A  Cognitive:  N/A  Insight: None  Engagement in Group:  None  Modes of Intervention:  N/A  Additional Comments:  Pt did not attend this group  Kristi HERO [facility] 05/25/2024, 4:35 PM

## 2024-05-25 NOTE — Group Note (Signed)
 Date:  05/25/2024 Time:  10:26 AM  Group Topic/Focus: Goals group  Patients began by introducing themselves and sharing their favorite food as an research scientist (life sciences) activity. After that, they were given SMART goal worksheets to complete. Examples of SMART goals were provided, and the patients had time to fill out their worksheets. Once finished, each patient shared their icebreaker responses and SMART goals with the group, encouraging social interaction and goal-setting.   Participation Level:  Minimal  Participation Quality:  Appropriate, Attentive, and Resistant  Affect:  Flat  Cognitive:  Alert and Appropriate  Insight: Improving and Lacking  Engagement in Group:  Improving, Poor, and Resistant  Modes of Intervention:  Discussion, Exploration, Socialization, and Support  Additional Comments:  Pt attended the group, sitting quietly and listening to her peers. She declined to participate in sharing her SMART goals but did share her icebreaker response.  [name] 05/25/2024, 10:26 AM

## 2024-05-25 NOTE — [facility] Group Notes (Signed)
 LCSW Group Therapy Note  05/25/2024   10:00am-11:00am  Type of Therapy and Topic:  Group Therapy: Gratitude  Participation Level:  Did Not Attend   Description of Group:   In this group, patients shared and discussed the importance of acknowledging the elements in their lives for which they are grateful and how this can positively impact their mood.  The group discussed how bringing the positive elements of their lives to the forefront of their minds can help with recovery from any illness, physical or mental.  An exercise was done as a group in which a list was made of gratitude items in order to encourage participants to consider other potential positives in their lives.  Therapeutic Goals: Patients will discuss quotes about gratitude and explore how a change of attitude can make life more joyful. Patients will identify one or more items for which they are grateful in each of 6 categories:  people, experiences, things, places, skills, and other. Patients will discuss how it is possible to seek out gratitude in even bad situations. Patients will explore how the lack of gratitude can bring them down.   Summary of Patient Progress:  Patient was invited to group, did not attend.   Therapeutic Modalities:   Solution-Focused Therapy Activity  Mishika Flippen J Grossman-Orr, LCSW .

## 2024-05-25 NOTE — Group Note (Signed)
 Date:  05/25/2024 Time:  10:56 AM  Group Topic/Focus: Social wellness  This group focused on the concept of control--gaining it, losing it, and sharing it. The patients sat in a circle with a blank piece of paper and were instructed to draw something on it. A timer was set for one minute, and when it went off, patients were to pass their papers to the left. They were encouraged to add to the drawing, whether it related to the original theme or not. At the end of the activity, patients shared their drawings, reflected on what was added, and discussed their experiences together. The group then explored how it felt to share control and consider different perspectives. This activity promoted creative thinking and fostered positive social interactions.    Participation Level:  Did Not Attend  Participation Quality:  N/A  Affect:  N/A  Cognitive:  N/A  Insight: None  Engagement in Group:  None  Modes of Intervention:  N/A  Additional Comments:  Pt did not attend this group  Kristi HERO [facility] 05/25/2024, 10:56 AM

## 2024-05-25 NOTE — Progress Notes (Signed)
" °   05/25/24 0900  Psych Admission Type (Psych Patients Only)  Admission Status Voluntary  Psychosocial Assessment  Patient Complaints None  Eye Contact Brief  Facial Expression Flat  Affect Flat  Speech Logical/coherent  Interaction Minimal  Motor Activity Slow  Appearance/Hygiene Unremarkable  Behavior Characteristics Cooperative;Calm  Mood Pleasant  Thought Process  Coherency WDL  Content WDL  Delusions None reported or observed  Perception WDL  Hallucination None reported or observed  Judgment Poor  Confusion None  Danger to Self  Current suicidal ideation? Denies  Agreement Not to Harm Self Yes  Description of Agreement Verbal  Danger to Others  Danger to Others None reported or observed    "

## 2024-05-25 NOTE — Progress Notes (Signed)
" °   05/25/24 2200  Psychosocial Assessment  Patient Complaints None  Eye Contact Brief  Facial Expression Flat  Affect Flat  Speech Logical/coherent  Interaction Minimal  Motor Activity Slow  Appearance/Hygiene Other (Comment) (pt in pjs)  Behavior Characteristics Cooperative  Mood Pleasant  Thought Process  Coherency WDL  Content WDL  Delusions None reported or observed  Perception WDL  Hallucination None reported or observed  Judgment Poor  Confusion None  Danger to Self  Current suicidal ideation? Denies  Danger to Others  Danger to Others None reported or observed    "

## 2024-05-25 NOTE — Group Note (Signed)
 Date:  05/25/2024 Time:  5:25 PM    Group Topic/Focus:  Dimensions of Wellness:   The focus of this group is to introduce the topic of wellness using collage. Patients created intuitive collages: serving as a creative outlet for expressing emotions, reducing anxiety, fostering group cohesion and enabling personal insight and healing.   Participation Level:  Active  Participation Quality:  Appropriate, Attentive, and Sharing  Affect:  Appropriate  Cognitive:  Alert and Appropriate  Insight: Good  Engagement in Group:  Engaged  Modes of Intervention:  Activity, Discussion, Exploration, Rapport Building, Socialization, and Support  Additional Comments:    Berwyn GORMAN Acosta 05/25/2024, 5:25 PM

## 2024-05-25 NOTE — Progress Notes (Signed)
 [facility] MD Progress Note  05/25/2024 1:21 PM Courtney Ashley  MRN:  [id number]  Reason for admission. 23 year old AA female with prior hx of mental illnesses & substance use disorder (THC). Admitted to this [facility] from the [facility] with complaint of inability to sleep x 4 days. Chart review reports indicated that patient did report auditory, visual hallucinations & feeling like there were people after her. She was medication non-compliant. After evaluation at the [facility], patient was recommended for inpatient psychiatric hospitalization & was brought to the [facility] for treatment. During this evaluation, patient reports, I walked to the ED by myself on Wednesday morning because I was up for 4 days, no sleep. I have not been on my medicines for about two months. I stopped taking them about two months ago. I stopped it on my own.   Daily notes: Courtney Ashley is seen this morning outside her room. Chart reviewed. Patient denies any acute concerns at this time.  She reports her mood has continued to improve and feels stable with regards to her bipolar disorder.  She denies any acute psychotic symptoms.  She denies SI/HI/AVH.  She denies any acute somatic complaints from current medications.  Patient is to follow-up with [facility] for psychiatry and psychotherapy.  Principal Problem: Bipolar affective disorder, mixed, severe, with psychotic behavior (HCC)  Diagnosis: Principal Problem:   Bipolar affective disorder, mixed, severe, with psychotic behavior (HCC) Active Problems:   Generalized anxiety disorder   Delta-9-tetrahydrocannabinol (THC) dependence (HCC)  Total Time spent with patient: 45 minutes  Past Psychiatric History: See H&P.  Past Medical History:  Past Medical History:  Diagnosis Date   ADHD (attention deficit hyperactivity disorder)    childhood   Anemia    Bipolar 2 disorder (HCC)    Chest pain    Delta-9-tetrahydrocannabinol (THC) dependence (HCC)    GAD (generalized anxiety disorder)    Insomnia     Nexplanon in place 2018   Psychosis (HCC)    Tobacco dependence     Past Surgical History:  Procedure Laterality Date   TONSILLECTOMY AND ADENOIDECTOMY     Family History:  Family History  Problem Relation Age of Onset   Diabetes Maternal Grandmother    Family Psychiatric  History: See H&P.  Social History:  Social History   Substance and Sexual Activity  Alcohol Use Not Currently     Social History   Substance and Sexual Activity  Drug Use Yes   Types: Marijuana    Social History   Socioeconomic History   Marital status: Single    Spouse name: Not on file   Number of children: Not on file   Years of education: Not on file   Highest education level: Not on file  Occupational History   Not on file  Tobacco Use   Smoking status: Some Days    Types: Cigarettes   Smokeless tobacco: Never  Vaping Use   Vaping status: Never Used  Substance and Sexual Activity   Alcohol use: Not Currently   Drug use: Yes    Types: Marijuana   Sexual activity: Yes    Birth control/protection: Implant  Other Topics Concern   Not on file  Social History Narrative   Not on file   Social Drivers of Health   Tobacco Use: High Risk (05/22/2024)   Patient History    Smoking Tobacco Use: Some Days    Smokeless Tobacco Use: Never    Passive Exposure: Not on file  Financial Resource Strain: Not on  file  Food Insecurity: No Food Insecurity (05/22/2024)   Epic    Worried About Programme Researcher, Broadcasting/film/video in the Last Year: Never true    Ran Out of Food in the Last Year: Never true  Transportation Needs: No Transportation Needs (05/22/2024)   Epic    Lack of Transportation (Medical): No    Lack of Transportation (Non-Medical): No  Physical Activity: Not on file  Stress: Not on file  Social Connections: Not on file  Depression (EYV7-0): Not on file  Alcohol Screen: Low Risk (05/21/2024)   Alcohol Screen    Last Alcohol Screening Score (AUDIT): 0  Housing: High Risk (05/22/2024)   Epic     Unable to Pay for Housing in the Last Year: No    Number of Times Moved in the Last Year: 2    Homeless in the Last Year: No  Utilities: Not At Risk (05/22/2024)   Epic    Threatened with loss of utilities: No  Health Literacy: Not on file   Additional Social History:   Sleep: Good Estimated Sleeping Duration (Last 24 Hours): 6.00-7.75 hours  Appetite:  Good  Current Medications: Current Facility-Administered Medications  Medication Dose Route Frequency Provider Last Rate Last Admin   ARIPiprazole  (ABILIFY ) tablet 10 mg  10 mg Oral Daily Nwoko, Agnes I, NP   10 mg at 05/25/24 0827   haloperidol  (HALDOL ) tablet 5 mg  5 mg Oral TID PRN Onuoha, Chinwendu V, NP       And   diphenhydrAMINE  (BENADRYL ) capsule 50 mg  50 mg Oral TID PRN Onuoha, Chinwendu V, NP       haloperidol  lactate (HALDOL ) injection 5 mg  5 mg Intramuscular TID PRN Onuoha, Chinwendu V, NP       And   diphenhydrAMINE  (BENADRYL ) injection 50 mg  50 mg Intramuscular TID PRN Onuoha, Chinwendu V, NP       And   LORazepam  (ATIVAN ) injection 2 mg  2 mg Intramuscular TID PRN Onuoha, Chinwendu V, NP       haloperidol  lactate (HALDOL ) injection 10 mg  10 mg Intramuscular TID PRN Onuoha, Chinwendu V, NP       And   diphenhydrAMINE  (BENADRYL ) injection 50 mg  50 mg Intramuscular TID PRN Onuoha, Chinwendu V, NP       And   LORazepam  (ATIVAN ) injection 2 mg  2 mg Intramuscular TID PRN Onuoha, Chinwendu V, NP       docusate sodium  (COLACE) capsule 100 mg  100 mg Oral Daily Nwoko, Agnes I, NP   100 mg at 05/25/24 0827   gabapentin  (NEURONTIN ) capsule 100 mg  100 mg Oral TID Collene Gouge I, NP   100 mg at 05/25/24 0827   hydrOXYzine  (ATARAX ) tablet 25 mg  25 mg Oral TID PRN Collene Gouge I, NP   25 mg at 05/24/24 2128   nicotine  polacrilex (NICORETTE ) gum 2 mg  2 mg Oral PRN Prentis Kitchens A, DO       sertraline  (ZOLOFT ) tablet 50 mg  50 mg Oral Daily Nwoko, Agnes I, NP   50 mg at 05/25/24 0827   traZODone  (DESYREL ) tablet 50 mg   50 mg Oral QHS PRN Nwoko, Agnes I, NP   50 mg at 05/24/24 2128   Vitamin D  (Ergocalciferol ) (DRISDOL ) 1.25 MG (50000 UNIT) capsule 50,000 Units  50,000 Units Oral Q7 days Lynnette Barter, MD   50,000 Units at 05/25/24 0827   Lab Results:  No results found for this or any  previous visit (from the past 48 hours).  Blood Alcohol level:  Lab Results  Component Value Date   [facility] <15 05/21/2024   ETH <15 05/21/2024   Metabolic Disorder Labs: Lab Results  Component Value Date   HGBA1C 5.2 05/21/2024   MPG 102.54 05/21/2024   MPG 108.28 02/25/2023   Lab Results  Component Value Date   PROLACTIN 15.6 05/21/2024   Lab Results  Component Value Date   CHOL 165 05/21/2024   TRIG 72 05/21/2024   HDL 59 05/21/2024   CHOLHDL 2.8 05/21/2024   VLDL 14 05/21/2024   LDLCALC 92 05/21/2024   LDLCALC 83 02/25/2023   Physical Findings: AIMS:  ,  ,  ,  ,  ,  ,   CIWA:    COWS:     Musculoskeletal: Strength & Muscle Tone: within normal limits Gait & Station: normal Patient leans: N/A  Psychiatric Specialty Exam:  Presentation  General Appearance:  Casual; Fairly Groomed  Eye Contact: Good  Speech: Clear and Coherent  Speech Volume: Normal  Handedness: Right  Mood and Affect  Mood: -- (Some improvement notes.)  Affect: Congruent  Thought Process  Thought Processes: Coherent; Goal Directed; Linear  Descriptions of Associations:Intact  Orientation:Full (Time, Place and Person)  Thought Content:Logical  History of Schizophrenia/Schizoaffective disorder: NA  Duration of Psychotic Symptoms: NA  Hallucinations:No data recorded  Ideas of Reference:None  Suicidal Thoughts:No data recorded  Homicidal Thoughts:No data recorded   Sensorium  Memory: Immediate Fair; Recent Good; Immediate Good; Remote Fair  Judgment: -- (improved some.)  Insight: Present   Executive Functions  Concentration: Good  Attention Span: Good  Recall: Fair  Fund of  Knowledge: Fair  Language: Good  Psychomotor Activity  Psychomotor Activity:No data recorded  Assets  Assets: Communication Skills; Desire for Improvement; Housing; Physical Health; Resilience; Social Support  Sleep  Sleep:No data recorded  Physical Exam: Physical Exam Vitals and nursing note reviewed.  HENT:     Head: Normocephalic.     Nose: Nose normal.  Cardiovascular:     Rate and Rhythm: Normal rate.     Pulses: Normal pulses.  Pulmonary:     Effort: Pulmonary effort is normal.  Genitourinary:    Comments: Deferred. Musculoskeletal:        General: Normal range of motion.     Cervical back: Normal range of motion.  Skin:    General: Skin is dry.  Neurological:     General: No focal deficit present.     Mental Status: She is alert and oriented to person, place, and time.    Review of Systems  Constitutional:  Negative for chills, diaphoresis and fever.  HENT:  Negative for congestion and sore throat.   Eyes:  Negative for blurred vision.  Respiratory:  Negative for cough, shortness of breath and wheezing.   Cardiovascular:  Negative for chest pain and palpitations.  Neurological:  Negative for dizziness, tingling, tremors, sensory change, speech change, focal weakness, seizures, loss of consciousness, weakness and headaches.  Psychiatric/Behavioral:  Positive for depression, hallucinations and substance abuse. Negative for memory loss. The patient is not nervous/anxious and does not have insomnia.    Blood pressure 124/78, pulse (!) 57, temperature 99.1 F (37.3 C), temperature source Oral, resp. rate 16, height 5' 6 (1.676 m), weight 61.5 kg, last menstrual period 05/12/2024, SpO2 100%. Body mass index is 21.89 kg/m.  Treatment Plan Summary: Daily contact with patient to assess and evaluate symptoms and progress in treatment and Medication management.  Patient sufficiently stable for discharge tomorrow. Encouraged medication compliance and attending  group therapy. Recommended LAI but patient refused. Safety planning to be completed with grandmother prior to patient discharge.    Principal/active diagnoses.  Bipolar affective disorder, mixed, severe, with psychotic behavior (HCC)Generalized anxiety disorder Delta-9-tetrahydrocannabinol (THC) dependence (HCC)    Plan: The risks/benefits/side-effects/alternatives to the medications in use were discussed in detail with the patient and time was given for patient's questions. The patient consents to medication trial.    -Continue Abilify  10 mg po daily (start tomorrow 05-23-24) for mood control.  -Continue Hydroxyzine  25 mg po tid prn for anxiety.  -Continue Sertraline  50 mg po daily for depression.  -Continue gabapentin  100 mg po tid for anxiety.  -Dulcolax 10 mg po once for constipation.  -Colace 100 mg po daily for constipation.   Agitation protocols.  -Continue as recommended.   Other PRNS -Continue Tylenol  650 mg every 6 hours PRN for mild pain -Continue Maalox 30 ml Q 4 hrs PRN for indigestion -Continue MOM 30 ml po Q 6 hrs for constipation   Safety and Monitoring: Voluntary admission to inpatient psychiatric unit for safety, stabilization and treatment Daily contact with patient to assess and evaluate symptoms and progress in treatment Patient's case to be discussed in multi-disciplinary team meeting Observation Level : q15 minute checks Vital signs: q12 hours Precautions: Safety   Discharge Planning: Social work and case management to assist with discharge planning and identification of hospital follow-up needs prior to discharge Estimated LOS: 5-7 days Discharge Concerns: Need to establish a safety plan; Medication compliance and effectiveness Discharge Goals: Return home with outpatient referrals for mental health follow-up including medication management/psychotherapy     Prentice Espy, MD 05/25/2024, 1:21 PM

## 2024-05-25 NOTE — Plan of Care (Signed)
   Problem: Education: Goal: Emotional status will improve Outcome: Progressing Goal: Mental status will improve Outcome: Progressing

## 2024-05-25 NOTE — Group Note (Deleted)
 Date:  05/25/2024 Time:  8:44 PM  Group Topic/Focus:  Self Esteem Action Plan:   The focus of this group is to help patients create a plan to continue to build self-esteem after discharge.     Participation Level:  {BHH PARTICIPATION OZCZO:77735}  Participation Quality:  {BHH PARTICIPATION QUALITY:22265}  Affect:  {BHH AFFECT:22266}  Cognitive:  {BHH COGNITIVE:22267}  Insight: {BHH Insight2:20797}  Engagement in Group:  {BHH ENGAGEMENT IN HMNLE:77731}  Modes of Intervention:  {BHH MODES OF INTERVENTION:22269}  Additional Comments:  ***  Courtney Ashley 05/25/2024, 8:44 PM

## 2024-05-25 NOTE — BHH Suicide Risk Assessment (Signed)
 B[facility] INPATIENT:  Family/Significant Other Suicide Prevention Education  Suicide Prevention Education:  Contact Attempts: Grandmother Suzen [phone], (name of family member/significant other) has been identified by the patient as the family member/significant other with whom the patient will be residing, and identified as the person(s) who will aid the patient in the event of a mental health crisis.  With written consent from the patient, two attempts were made to provide suicide prevention education, prior to and/or following the patient's discharge.  We were unsuccessful in providing suicide prevention education.  A suicide education pamphlet was given to the patient to share with family/significant other.  Date and time of first attempt: 05/25/24 @ 1140AM, LVM Date and time of second attempt: Needed  Jenkins LULLA Primer 05/25/2024, 1:07 PM

## 2024-05-25 NOTE — BHH Group Notes (Signed)
 Pt did not attend the CSW group today, 05/25/24 (1000-1100)

## 2024-05-26 MED ORDER — VITAMIN D (ERGOCALCIFEROL) 1.25 MG (50000 UNIT) PO CAPS: 50000.0000 [IU] | ORAL_CAPSULE | ORAL | 0 refills | Status: DC

## 2024-05-26 MED ORDER — NICOTINE POLACRILEX 2 MG MT GUM: 2.0000 mg | CHEWING_GUM | OROMUCOSAL | 0 refills | Status: DC | PRN

## 2024-05-26 MED ORDER — SERTRALINE HCL 50 MG PO TABS: 50.0000 mg | ORAL_TABLET | Freq: Every day | ORAL | 0 refills | Status: AC

## 2024-05-26 MED ORDER — NICOTINE POLACRILEX 2 MG MT GUM: 2.0000 mg | CHEWING_GUM | OROMUCOSAL | 0 refills | Status: AC | PRN

## 2024-05-26 MED ORDER — SERTRALINE HCL 50 MG PO TABS: 50.0000 mg | ORAL_TABLET | Freq: Every day | ORAL | 0 refills | Status: DC

## 2024-05-26 MED ORDER — ARIPIPRAZOLE 10 MG PO TABS: 10.0000 mg | ORAL_TABLET | Freq: Every day | ORAL | 0 refills | Status: DC

## 2024-05-26 MED ORDER — GABAPENTIN 100 MG PO CAPS: 100.0000 mg | ORAL_CAPSULE | Freq: Three times a day (TID) | ORAL | 0 refills | Status: AC

## 2024-05-26 MED ORDER — ARIPIPRAZOLE 10 MG PO TABS: 10.0000 mg | ORAL_TABLET | Freq: Every day | ORAL | 0 refills | Status: AC

## 2024-05-26 MED ORDER — GABAPENTIN 100 MG PO CAPS: 100.0000 mg | ORAL_CAPSULE | Freq: Three times a day (TID) | ORAL | 0 refills | Status: DC

## 2024-05-26 MED ORDER — VITAMIN D (ERGOCALCIFEROL) 1.25 MG (50000 UNIT) PO CAPS: 50000.0000 [IU] | ORAL_CAPSULE | ORAL | 0 refills | Status: AC

## 2024-05-26 NOTE — Progress Notes (Signed)
 Pt discharged to lobby with taxi voucher. Pt was stable and appreciative at that time. All papers and prescriptions were given and valuables returned. Suicide safety plan completed.  Verbal understanding expressed. Denies SI/HI and A/VH. Pt given opportunity to express concerns and ask questions.

## 2024-05-26 NOTE — BHH Suicide Risk Assessment (Signed)
 [facility] INPATIENT:  Family/Significant Other Suicide Prevention Education   Contact Attempts: Grandmother Courtney Ashley [phone] *pt lives with her 1st att 05/25/2024 1140am, left voicemail. 2nd attempt 05/26/2024 0855am, has been identified by the patient as the family member/significant other with whom the patient will be residing, and identified as the person(s) who will aid the patient in the event of a mental health crisis.  With written consent from the patient, two attempts were made to provide suicide prevention education, prior to and/or following the patient's discharge.  We were unsuccessful in providing suicide prevention education.  A suicide education pamphlet was given to the patient to share with family/significant other.  Date and time of first attempt:05/25/2024 at 1140am Date and time of second attempt:05/26/2024 at 8am  Suicide Prevention Education:  Education Completed; Courtney Ashley patient has been identified as the person(s) who will aid the patient in the event of a mental health crisis (suicidal ideations/suicide attempt).  With written consent from the patient, the family member/significant other has been provided the following suicide prevention education, prior to the and/or following the discharge of the patient.  The suicide prevention education provided includes the following: Suicide risk factors Suicide prevention and interventions National Suicide Hotline telephone number [facility] assessment telephone number [facility] Emergency Assistance [address] [facility] and/or Residential Mobile Crisis Unit telephone number  Request made of family/significant other to: Remove weapons (e.g., guns, rifles, knives), all items previously/currently identified as safety concern.   Remove drugs/medications (over-the-counter, prescriptions, illicit drugs), all items previously/currently identified as a safety concern.   Courtney Ashley 05/26/2024, 11:13  AM

## 2024-05-26 NOTE — Hospital Course (Signed)
 Reason for Admission: Courtney Ashley is a 23 year old AA female with prior hx of mental illnesses & substance use disorder (THC). Admitted to this [facility] from the [facility] with complaint of 4 days without sleep with auditory and visual hallucinations in setting of medication noncompliance.   Hospital Course  During the patient's hospitalization, patient had extensive initial psychiatric evaluation, and follow-up psychiatric evaluations every day.  Psychiatric diagnoses provided upon initial assessment:  Bipolar affective disorder, mixed, severe, with psychotic behavior (HCC) Generalized anxiety disorder Delta-9-tetrahydrocannabinol (THC) dependence (HCC)  Patient was started on abilify  which was uptitrated to 10 mg with good effect in management of her reported psychotic symptoms and manic symptoms. Sertraline  was initiated after this for management of GAD as well as gabapentin . Vitamin D  level was found to be low so weekly ergocalciferol  was ordered to aid with supplementation. Intermittent use of hydroxyzine  and trazodone  for anxiety and insomnia respectively to good effect. No agitation PRNs were required.   # Patient was discharged home with a plan to follow up as noted below.

## 2024-05-26 NOTE — Progress Notes (Signed)
" °   05/26/24 0800  Psych Admission Type (Psych Patients Only)  Admission Status Voluntary  Psychosocial Assessment  Patient Complaints None  Eye Contact Brief  Facial Expression Flat  Affect Flat  Speech Logical/coherent  Interaction Minimal  Motor Activity Slow  Appearance/Hygiene Other (Comment) (level 3 observation)  Behavior Characteristics Cooperative  Mood Euthymic;Pleasant  Thought Process  Coherency WDL  Content WDL  Delusions None reported or observed  Perception WDL  Hallucination None reported or observed  Judgment Poor  Confusion None  Danger to Self  Current suicidal ideation? Denies  Danger to Others  Danger to Others None reported or observed    "

## 2024-05-26 NOTE — Discharge Summary (Signed)
 " Physician Discharge Summary Note Patient:  Courtney Ashley is an 23 y.o., female MRN:  [id number] DOB:  02/13/2001 Patient phone:  [phone] (home)  Patient address:   [address] [place] [address],  Total Time spent with patient: 1 hour  Date of Admission:  05/22/2024 Date of Discharge: 05/26/24  Reason for Admission: Courtney Ashley is a 53 year old AA female with prior hx of mental illnesses & substance use disorder (THC). Admitted to this [facility] from the [facility] with complaint of 4 days without sleep with auditory and visual hallucinations in setting of medication noncompliance.   Hospital Course  During the patient's hospitalization, patient had extensive initial psychiatric evaluation, and follow-up psychiatric evaluations every day.  Psychiatric diagnoses provided upon initial assessment:  Bipolar affective disorder, mixed, severe, with psychotic behavior (HCC) Generalized anxiety disorder Delta-9-tetrahydrocannabinol (THC) dependence (HCC)  Patient was started on abilify  which was uptitrated to 10 mg with good effect in management of her reported psychotic symptoms and manic symptoms. Sertraline  was initiated after this for management of GAD as well as gabapentin . Vitamin D  level was found to be low so weekly ergocalciferol  was ordered to aid with supplementation. Intermittent use of hydroxyzine  and trazodone  for anxiety and insomnia respectively to good effect. No agitation PRNs were required.   # Patient was discharged home with a plan to follow up as noted below.    Principal Problem: Bipolar affective disorder, mixed, severe, with psychotic behavior (HCC) Discharge Diagnoses: Principal Problem:   Bipolar affective disorder, mixed, severe, with psychotic behavior (HCC) Active Problems:   Generalized anxiety disorder   Delta-9-tetrahydrocannabinol (THC) dependence (HCC)   Past Medical History:  Past Medical History:  Diagnosis Date   ADHD (attention deficit  hyperactivity disorder)    childhood   Anemia    Bipolar 2 disorder (HCC)    Chest pain    Delta-9-tetrahydrocannabinol (THC) dependence (HCC)    GAD (generalized anxiety disorder)    Insomnia    Nexplanon in place 2018   Psychosis (HCC)    Tobacco dependence     Past Surgical History:  Procedure Laterality Date   TONSILLECTOMY AND ADENOIDECTOMY      Family History:  Family History  Problem Relation Age of Onset   Diabetes Maternal Grandmother     Social History:  Social History   Socioeconomic History   Marital status: Single    Spouse name: Not on file   Number of children: Not on file   Years of education: Not on file   Highest education level: Not on file  Occupational History   Not on file  Tobacco Use   Smoking status: Some Days    Types: Cigarettes   Smokeless tobacco: Never  Vaping Use   Vaping status: Never Used  Substance and Sexual Activity   Alcohol use: Not Currently   Drug use: Yes    Types: Marijuana   Sexual activity: Yes    Birth control/protection: Implant  Other Topics Concern   Not on file  Social History Narrative   Not on file   Social Drivers of Health   Tobacco Use: High Risk (05/22/2024)   Patient History    Smoking Tobacco Use: Some Days    Smokeless Tobacco Use: Never    Passive Exposure: Not on file  Financial Resource Strain: Not on file  Food Insecurity: No Food Insecurity (05/22/2024)   Epic    Worried About Radiation Protection Practitioner of Food in the Last Year: Never true  Ran Out of Food in the Last Year: Never true  Transportation Needs: No Transportation Needs (05/22/2024)   Epic    Lack of Transportation (Medical): No    Lack of Transportation (Non-Medical): No  Physical Activity: Not on file  Stress: Not on file  Social Connections: Not on file  Intimate Partner Violence: Not At Risk (05/22/2024)   Epic    Fear of Current or Ex-Partner: No    Emotionally Abused: No    Physically Abused: No    Sexually Abused: No   Depression (PHQ2-9): Not on file  Alcohol Screen: Low Risk (05/21/2024)   Alcohol Screen    Last Alcohol Screening Score (AUDIT): 0  Housing: High Risk (05/22/2024)   Epic    Unable to Pay for Housing in the Last Year: No    Number of Times Moved in the Last Year: 2    Homeless in the Last Year: No  Utilities: Not At Risk (05/22/2024)   Epic    Threatened with loss of utilities: No  Health Literacy: Not on file     Physical Findings: AIMS:  , ,  ,  ,    CIWA:    COWS:     Musculoskeletal: Strength & Muscle Tone: within normal limits Gait & Station: normal Patient leans: N/A  Psychiatric Specialty Exam  Presentation  General Appearance: Casual; Fairly Groomed  Eye Contact:Good  Speech:Clear and Coherent  Speech Volume:Normal   Mood and Affect  Mood:-- (Some improvement notes.)  Affect:Congruent   Thought Process  Thought Processes:Coherent; Goal Directed; Linear  Descriptions of Associations:Intact  Orientation:Full (Time, Place and Person)  Thought Content:Logical  Hallucinations:No data recorded Ideas of Reference:None  Suicidal Thoughts:No data recorded Homicidal Thoughts:No data recorded  Sensorium  Memory:Immediate Fair; Recent Good; Immediate Good; Remote Fair  Judgment:-- (improved some.)  Insight:Present   Executive Functions  Concentration:Good  Attention Span:Good  Recall:Fair  Fund of Knowledge:Fair  Language:Good   Psychomotor Activity  Psychomotor Activity:No data recorded  Assets  Assets:Communication Skills; Desire for Improvement; Housing; Physical Health; Resilience; Social Support   Sleep  Sleep:No data recorded  Physical Exam: Physical Exam ROS Blood pressure 137/67, pulse 67, temperature 98.6 F (37 C), temperature source Oral, resp. rate 16, height 5' 6 (1.676 m), weight 61.5 kg, last menstrual period 05/12/2024, SpO2 100%. Body mass index is 21.89 kg/m.  Tobacco Use History[1] Tobacco Cessation:   A prescription for an FDA-approved tobacco cessation medication provided at discharge  Blood Alcohol level:  Lab Results  Component Value Date   [facility] <15 05/21/2024   ETH <15 05/21/2024    Metabolic Disorder Labs:  Lab Results  Component Value Date   HGBA1C 5.2 05/21/2024   MPG 102.54 05/21/2024   MPG 108.28 02/25/2023   Lab Results  Component Value Date   PROLACTIN 15.6 05/21/2024   Lab Results  Component Value Date   CHOL 165 05/21/2024   TRIG 72 05/21/2024   HDL 59 05/21/2024   CHOLHDL 2.8 05/21/2024   VLDL 14 05/21/2024   LDLCALC 92 05/21/2024   LDLCALC 83 02/25/2023    See Psychiatric Specialty Exam and Suicide Risk Assessment completed by Attending Physician prior to discharge.  Discharge destination:  Home  Is patient on multiple antipsychotic therapies at discharge:  No   Has Patient had three or more failed trials of antipsychotic monotherapy by history:  No  Recommended Plan for Multiple Antipsychotic Therapies: NA   Allergies as of 05/26/2024       Reactions  Fish Allergy Itching, Swelling        Medication List     TAKE these medications      Indication  ARIPiprazole  10 MG tablet Commonly known as: ABILIFY  Take 1 tablet (10 mg total) by mouth daily.  Indication: Mood control.   gabapentin  100 MG capsule Commonly known as: NEURONTIN  Take 1 capsule (100 mg total) by mouth 3 (three) times daily.  Indication: Anxiety   nicotine  polacrilex 2 MG gum Commonly known as: NICORETTE  Take 1 each (2 mg total) by mouth as needed for smoking cessation.  Indication: Nicotine  Addiction   sertraline  50 MG tablet Commonly known as: ZOLOFT  Take 1 tablet (50 mg total) by mouth daily.  Indication: Generalized Anxiety Disorder, Depression.   Vitamin D  (Ergocalciferol ) 1.25 MG (50000 UNIT) Caps capsule Commonly known as: DRISDOL  Take 1 capsule (50,000 Units total) by mouth every 7 (seven) days. Start taking on: June 01, 2024  Indication: Vitamin  D Deficiency        Follow-up Information     [facility] Go on 05/31/2024.   Why: You have a hospital follow up appointment for therapy and medication management services on 05/31/24 at 11:30 am.  The appointment will be Virtual, telehealth. Contact information: [address]  [address] [place] [address] [phone]                  Follow-up recommendations:   Activity:  as tolerated Diet:  heart healthy   Comments:  Prescriptions were given at discharge.  Patient is agreeable with the discharge plan.  Patient was given an opportunity to ask questions.  Patient appears to feel comfortable with discharge and denies any current suicidal or homicidal thoughts.    Patient is instructed prior to discharge to: Take all medications as prescribed by mental healthcare provider. Report any adverse effects and or reactions from the medicines to outpatient provider promptly. In the event of worsening symptoms, patient is instructed to call the crisis hotline, 911 and or go to the nearest ED for appropriate evaluation and treatment of symptoms. Patient is to follow-up with primary care provider for other medical issues, concerns and or health care needs.     Signed: Prentice Espy, MD 05/26/2024, 8:56 AM    [1]  Social History Tobacco Use  Smoking Status Some Days   Types: Cigarettes  Smokeless Tobacco Never   "

## 2024-05-26 NOTE — Progress Notes (Signed)
" °  [facility] Adult Case Management Discharge Plan :  Will you be returning to the same living situation after discharge:  Yes,  home with grandmother Suzen ph: [phone].  At discharge, do you have transportation home?: Yes,  [employer] taxi provided for 1130am. Patient completed the [employer] and Release of Liability, scanned to HIM.  Do you have the ability to pay for your medications: Yes,  via insurance  Release of information consent forms completed and in the chart;  Patient's signature needed at discharge.  Patient to Follow up at:  Follow-up Information     [facility] Go on 05/31/2024.   Why: You have a hospital follow up appointment for therapy and medication management services on 05/31/24 at 11:30 am.  The appointment will be Virtual, telehealth. Contact information: [address]  [address] [place] [address] [phone]                 Next level of care provider has access to [facility] Link:no  Safety Planning and Suicide Prevention discussed: Yes,  completed with the patient and patient was provided the Suicide Prevention Information pamphlet.     Has patient been referred to the Quitline?: Patient refused referral for treatment  Patient has been referred for addiction treatment: Patient refused referral for treatment.  [employer], LCSW 05/26/2024, 11:21 AM "

## 2024-05-26 NOTE — [facility] Suicide Risk Assessment (Signed)
 [facility] Discharge Suicide Risk Assessment   Principal Problem: Bipolar affective disorder, mixed, severe, with psychotic behavior (HCC) Discharge Diagnoses: Principal Problem:   Bipolar affective disorder, mixed, severe, with psychotic behavior (HCC) Active Problems:   Generalized anxiety disorder   Delta-9-tetrahydrocannabinol (THC) dependence (HCC)   Reason for Admission: Courtney Ashley is a 23 year old AA female with prior hx of mental illnesses & substance use disorder (THC). Admitted to this [facility] from the [facility] with complaint of 4 days without sleep with auditory and visual hallucinations in setting of medication noncompliance.   Hospital Course  During the patient's hospitalization, patient had extensive initial psychiatric evaluation, and follow-up psychiatric evaluations every day.  Psychiatric diagnoses provided upon initial assessment:  Bipolar affective disorder, mixed, severe, with psychotic behavior (HCC) Generalized anxiety disorder Delta-9-tetrahydrocannabinol (THC) dependence (HCC)  Patient was started on abilify  which was uptitrated to 10 mg with good effect in management of her reported psychotic symptoms and manic symptoms. Sertraline  was initiated after this for management of GAD as well as gabapentin . Vitamin D  level was found to be low so weekly ergocalciferol  was ordered to aid with supplementation. Intermittent use of hydroxyzine  and trazodone  for anxiety and insomnia respectively to good effect. No agitation PRNs were required.   # Patient was discharged home with a plan to follow up as noted below.   Total Time spent with patient: 1 hour  Musculoskeletal: Strength & Muscle Tone: within normal limits Gait & Station: normal Patient leans: N/A  Psychiatric Specialty Exam  Presentation  General Appearance: Casual; Fairly Groomed   Eye Contact:Good   Speech:Clear and Coherent   Speech Volume:Normal     Mood and Affect  Mood:-- (Some improvement  notes.)   Affect:Congruent    Thought Process  Thought Processes:Coherent; Goal Directed; Linear   Descriptions of Associations:Intact   Orientation:Full (Time, Place and Person)   Thought Content:Logical   Hallucinations:No data recorded Ideas of Reference:None   Suicidal Thoughts:No data recorded Homicidal Thoughts:No data recorded  Sensorium  Memory:Immediate Fair; Recent Good; Immediate Good; Remote Fair   Judgment:-- (improved some.)   Insight:Present    Executive Functions  Concentration:Good   Attention Span:Good   Recall:Fair   Fund of Knowledge:Fair   Language:Good    Psychomotor Activity  Psychomotor Activity:No data recorded  Assets  Assets:Communication Skills; Desire for Improvement; Housing; Physical Health; Resilience; Social Support    Sleep  Sleep:No data recorded  Blood pressure 137/67, pulse 67, temperature 98.6 F (37 C), temperature source Oral, resp. rate 16, height 5' 6 (1.676 m), weight 61.5 kg, last menstrual period 05/12/2024, SpO2 100%. Body mass index is 21.89 kg/m.  Mental Status Per Nursing Assessment::   On Admission:  NA  Demographic Factors:  Adolescent or young adult  Loss Factors: NA  Historical Factors: Impulsivity  Risk Reduction Factors:   Living with another person, especially a relative, Positive social support, Positive therapeutic relationship, and Positive coping skills or problem solving skills  Continued Clinical Symptoms:  Severe Anxiety and/or Agitation Bipolar Disorder:   Mixed State More than one psychiatric diagnosis Previous Psychiatric Diagnoses and Treatments  Cognitive Features That Contribute To Risk:  None    Suicide Risk:  Minimal: No identifiable suicidal ideation.  Patients presenting with no risk factors but with morbid ruminations; may be classified as minimal risk based on the severity of the depressive symptoms   Follow-up Information     [facility] Go on  05/31/2024.   Why: You have a hospital follow up appointment for  therapy and medication management services on 05/31/24 at 11:30 am.  The appointment will be Virtual, telehealth. Contact information: [address]  [address] [place] [address] [phone]                 Follow-up recommendations:   Activity:  as tolerated Diet:  heart healthy   Comments:  Prescriptions were given at discharge.  Patient is agreeable with the discharge plan.  Patient was given an opportunity to ask questions.  Patient appears to feel comfortable with discharge and denies any current suicidal or homicidal thoughts.    Patient is instructed prior to discharge to: Take all medications as prescribed by mental healthcare provider. Report any adverse effects and or reactions from the medicines to outpatient provider promptly. In the event of worsening symptoms, patient is instructed to call the crisis hotline, 911 and or go to the nearest ED for appropriate evaluation and treatment of symptoms. Patient is to follow-up with primary care provider for other medical issues, concerns and or health care needs.    Prentice Espy, MD 05/26/2024, 8:55 AM

## 2024-05-26 NOTE — Plan of Care (Signed)
  Problem: Education: Goal: Mental status will improve Outcome: Progressing   

## 2024-05-26 NOTE — Progress Notes (Signed)
(  Sleep Hours) - 9.5 (Any PRNs that were needed, meds refused, or side effects to meds)- none (Any disturbances and when (visitation, over night)- none (Concerns raised by the patient)-  none (SI/HI/AVH)- denies all

## 2024-05-26 NOTE — Group Note (Signed)
 Date:  05/26/2024 Time:  10:53 AM  Group Topic/Focus:  Goals Group:   The focus of this group is to help patients establish daily goals to achieve during treatment and discuss how the patient can incorporate goal setting into their daily lives to aide in recovery.    Participation Level:  Did Not Attend   Courtney Ashley 05/26/2024, 10:53 AM

## 2024-05-31 ENCOUNTER — Ambulatory Visit (HOSPITAL_COMMUNITY)

## 2024-06-03 ENCOUNTER — Ambulatory Visit (HOSPITAL_COMMUNITY): Payer: MEDICAID | Admitting: Psychiatry

## 2024-06-10 ENCOUNTER — Ambulatory Visit (HOSPITAL_COMMUNITY)

## 2024-07-08 ENCOUNTER — Other Ambulatory Visit (HOSPITAL_COMMUNITY): Payer: Self-pay | Admitting: Student

## 2024-07-09 NOTE — Telephone Encounter (Signed)
 Patient was seen by this provider while hospitalized. She will need to contact outpatient psychiatrist to address.

## 2024-07-11 ENCOUNTER — Other Ambulatory Visit: Payer: Self-pay | Admitting: Medical Genetics
